# Patient Record
Sex: Female | Born: 1970 | Race: White | Hispanic: No | Marital: Married | Smoking: Never smoker
Health system: Southern US, Community
[De-identification: ages and names within clinical notes are randomized; demographics above are authoritative.]

---

## 2004-07-14 ENCOUNTER — Ambulatory Visit: Payer: Self-pay | Admitting: Obstetrics and Gynecology

## 2006-06-07 ENCOUNTER — Ambulatory Visit: Payer: Self-pay | Admitting: Surgery

## 2006-06-13 ENCOUNTER — Ambulatory Visit: Payer: Self-pay | Admitting: Internal Medicine

## 2006-06-17 ENCOUNTER — Ambulatory Visit: Payer: Self-pay | Admitting: Internal Medicine

## 2006-06-25 ENCOUNTER — Ambulatory Visit: Payer: Self-pay | Admitting: Surgery

## 2006-07-14 ENCOUNTER — Ambulatory Visit: Payer: Self-pay | Admitting: Internal Medicine

## 2006-08-13 ENCOUNTER — Ambulatory Visit: Payer: Self-pay | Admitting: Internal Medicine

## 2006-09-13 ENCOUNTER — Ambulatory Visit: Payer: Self-pay | Admitting: Internal Medicine

## 2006-09-19 ENCOUNTER — Ambulatory Visit: Payer: Self-pay | Admitting: Surgery

## 2006-09-19 ENCOUNTER — Other Ambulatory Visit: Payer: Self-pay

## 2006-09-24 ENCOUNTER — Inpatient Hospital Stay: Payer: Self-pay | Admitting: Surgery

## 2006-10-14 ENCOUNTER — Ambulatory Visit: Payer: Self-pay | Admitting: Internal Medicine

## 2006-11-13 ENCOUNTER — Encounter: Payer: Self-pay | Admitting: Internal Medicine

## 2006-11-13 ENCOUNTER — Ambulatory Visit: Payer: Self-pay | Admitting: Internal Medicine

## 2006-12-14 ENCOUNTER — Ambulatory Visit: Payer: Self-pay | Admitting: Internal Medicine

## 2007-01-13 ENCOUNTER — Ambulatory Visit: Payer: Self-pay | Admitting: Internal Medicine

## 2007-02-13 ENCOUNTER — Ambulatory Visit: Payer: Self-pay | Admitting: Internal Medicine

## 2007-03-16 ENCOUNTER — Ambulatory Visit: Payer: Self-pay | Admitting: Internal Medicine

## 2007-04-13 ENCOUNTER — Ambulatory Visit: Payer: Self-pay | Admitting: Internal Medicine

## 2007-05-14 ENCOUNTER — Ambulatory Visit: Payer: Self-pay | Admitting: Internal Medicine

## 2007-06-13 ENCOUNTER — Ambulatory Visit: Payer: Self-pay | Admitting: Internal Medicine

## 2007-06-26 ENCOUNTER — Other Ambulatory Visit: Payer: Self-pay

## 2007-06-26 ENCOUNTER — Ambulatory Visit: Payer: Self-pay | Admitting: Obstetrics and Gynecology

## 2007-07-04 ENCOUNTER — Ambulatory Visit: Payer: Self-pay | Admitting: Obstetrics and Gynecology

## 2007-07-14 ENCOUNTER — Ambulatory Visit: Payer: Self-pay | Admitting: Internal Medicine

## 2007-08-13 ENCOUNTER — Ambulatory Visit: Payer: Self-pay | Admitting: Internal Medicine

## 2007-09-13 ENCOUNTER — Ambulatory Visit: Payer: Self-pay | Admitting: Internal Medicine

## 2007-10-14 ENCOUNTER — Ambulatory Visit: Payer: Self-pay | Admitting: Internal Medicine

## 2007-11-13 ENCOUNTER — Ambulatory Visit: Payer: Self-pay | Admitting: Internal Medicine

## 2007-12-14 ENCOUNTER — Ambulatory Visit: Payer: Self-pay | Admitting: Internal Medicine

## 2008-01-13 ENCOUNTER — Ambulatory Visit: Payer: Self-pay | Admitting: Internal Medicine

## 2008-01-19 ENCOUNTER — Ambulatory Visit: Payer: Self-pay | Admitting: Internal Medicine

## 2008-02-13 ENCOUNTER — Ambulatory Visit: Payer: Self-pay | Admitting: Internal Medicine

## 2008-03-01 ENCOUNTER — Ambulatory Visit: Payer: Self-pay | Admitting: Internal Medicine

## 2008-03-15 ENCOUNTER — Ambulatory Visit: Payer: Self-pay | Admitting: Internal Medicine

## 2008-05-13 ENCOUNTER — Ambulatory Visit: Payer: Self-pay | Admitting: Internal Medicine

## 2008-05-24 ENCOUNTER — Ambulatory Visit: Payer: Self-pay | Admitting: Internal Medicine

## 2008-06-12 ENCOUNTER — Ambulatory Visit: Payer: Self-pay | Admitting: Internal Medicine

## 2008-08-12 ENCOUNTER — Ambulatory Visit: Payer: Self-pay | Admitting: Internal Medicine

## 2008-08-30 ENCOUNTER — Ambulatory Visit: Payer: Self-pay | Admitting: Internal Medicine

## 2008-09-12 ENCOUNTER — Ambulatory Visit: Payer: Self-pay | Admitting: Internal Medicine

## 2008-10-01 ENCOUNTER — Ambulatory Visit: Payer: Self-pay | Admitting: Surgery

## 2009-02-12 ENCOUNTER — Ambulatory Visit: Payer: Self-pay | Admitting: Internal Medicine

## 2009-02-28 ENCOUNTER — Ambulatory Visit: Payer: Self-pay | Admitting: Internal Medicine

## 2009-03-15 ENCOUNTER — Ambulatory Visit: Payer: Self-pay | Admitting: Internal Medicine

## 2009-08-12 ENCOUNTER — Ambulatory Visit: Payer: Self-pay | Admitting: Internal Medicine

## 2009-08-29 ENCOUNTER — Ambulatory Visit: Payer: Self-pay | Admitting: Internal Medicine

## 2009-08-30 LAB — CANCER ANTIGEN 27.29: CA 27.29: 22 U/mL (ref 0.0–38.6)

## 2009-09-12 ENCOUNTER — Ambulatory Visit: Payer: Self-pay | Admitting: Internal Medicine

## 2009-10-13 ENCOUNTER — Ambulatory Visit: Payer: Self-pay | Admitting: Internal Medicine

## 2010-02-27 ENCOUNTER — Ambulatory Visit: Payer: Self-pay | Admitting: Internal Medicine

## 2010-02-28 LAB — CANCER ANTIGEN 27.29: CA 27.29: 22.8 U/mL (ref 0.0–38.6)

## 2010-03-15 ENCOUNTER — Ambulatory Visit: Payer: Self-pay | Admitting: Internal Medicine

## 2010-04-13 ENCOUNTER — Ambulatory Visit: Payer: Self-pay | Admitting: Internal Medicine

## 2010-09-11 ENCOUNTER — Ambulatory Visit: Payer: Self-pay | Admitting: Internal Medicine

## 2010-09-13 ENCOUNTER — Ambulatory Visit: Payer: Self-pay | Admitting: Internal Medicine

## 2010-10-14 ENCOUNTER — Ambulatory Visit: Payer: Self-pay | Admitting: Internal Medicine

## 2011-07-06 ENCOUNTER — Ambulatory Visit: Payer: Self-pay | Admitting: Oncology

## 2011-09-12 ENCOUNTER — Ambulatory Visit: Payer: Self-pay | Admitting: Oncology

## 2011-09-12 LAB — COMPREHENSIVE METABOLIC PANEL
Albumin: 4.1 g/dL (ref 3.4–5.0)
Alkaline Phosphatase: 115 U/L (ref 50–136)
Anion Gap: 8 (ref 7–16)
BUN: 15 mg/dL (ref 7–18)
Calcium, Total: 9.5 mg/dL (ref 8.5–10.1)
Creatinine: 1.11 mg/dL (ref 0.60–1.30)
EGFR (African American): >60
Glucose: 129 mg/dL — ABNORMAL HIGH (ref 65–99)
Osmolality: 278 (ref 275–301)
Potassium: 3.9 mmol/L (ref 3.5–5.1)
Sodium: 138 mmol/L (ref 136–145)
Total Protein: 7.6 g/dL (ref 6.4–8.2)

## 2011-09-12 LAB — CBC CANCER CENTER
Basophil #: 0 x10 3/mm (ref 0.0–0.1)
Basophil %: 1.1 %
Eosinophil #: 0.2 x10 3/mm (ref 0.0–0.7)
HCT: 36.8 % (ref 35.0–47.0)
HGB: 12.5 g/dL (ref 12.0–16.0)
Lymphocyte #: 1.6 x10 3/mm (ref 1.0–3.6)
MCH: 31.3 pg (ref 26.0–34.0)
MCHC: 34.1 g/dL (ref 32.0–36.0)
Monocyte #: 0.2 x10 3/mm (ref 0.2–0.9)
Neutrophil %: 51 %
Platelet: 250 x10 3/mm (ref 150–440)

## 2011-09-13 ENCOUNTER — Ambulatory Visit: Payer: Self-pay | Admitting: Oncology

## 2011-09-13 LAB — CANCER ANTIGEN 27.29: CA 27.29: 13.8 U/mL (ref 0.0–38.6)

## 2012-09-10 ENCOUNTER — Ambulatory Visit: Payer: Self-pay | Admitting: Oncology

## 2012-09-11 LAB — CANCER ANTIGEN 27.29: CA 27.29: 19.7 U/mL (ref 0.0–38.6)

## 2012-09-12 ENCOUNTER — Ambulatory Visit: Payer: Self-pay | Admitting: Oncology

## 2012-10-13 ENCOUNTER — Ambulatory Visit: Payer: Self-pay | Admitting: Oncology

## 2013-09-09 ENCOUNTER — Ambulatory Visit: Payer: Self-pay | Admitting: Oncology

## 2013-09-10 LAB — CANCER ANTIGEN 27.29: CA 27.29: 19.7 U/mL (ref 0.0–38.6)

## 2013-09-12 ENCOUNTER — Ambulatory Visit: Payer: Self-pay | Admitting: Oncology

## 2015-06-30 ENCOUNTER — Emergency Department
Admission: EM | Admit: 2015-06-30 | Discharge: 2015-06-30 | Disposition: A | Discharge status: Home / Self Care | Payer: Self-pay | Attending: Emergency Medicine | Admitting: Emergency Medicine

## 2015-06-30 ENCOUNTER — Emergency Department: Payer: Self-pay

## 2015-06-30 ENCOUNTER — Encounter: Payer: Self-pay | Admitting: *Deleted

## 2015-06-30 DIAGNOSIS — Y999 Unspecified external cause status: Secondary | ICD-10-CM | POA: Insufficient documentation

## 2015-06-30 DIAGNOSIS — W010XXA Fall on same level from slipping, tripping and stumbling without subsequent striking against object, initial encounter: Secondary | ICD-10-CM | POA: Insufficient documentation

## 2015-06-30 DIAGNOSIS — S6991XA Unspecified injury of right wrist, hand and finger(s), initial encounter: Secondary | ICD-10-CM | POA: Insufficient documentation

## 2015-06-30 DIAGNOSIS — Y939 Activity, unspecified: Secondary | ICD-10-CM | POA: Insufficient documentation

## 2015-06-30 DIAGNOSIS — Y929 Unspecified place or not applicable: Secondary | ICD-10-CM | POA: Insufficient documentation

## 2015-06-30 DIAGNOSIS — M25531 Pain in right wrist: Secondary | ICD-10-CM

## 2015-06-30 NOTE — ED Notes (Signed)

## 2015-06-30 NOTE — Discharge Instructions (Signed)
Joint Pain  Joint pain can be caused by many things. The joint can be bruised, infected, weak from aging, or sore from exercise. The pain will probably go away if you follow your doctor's instructions for home care. If your joint pain continues, more tests may be needed to help find the cause of your condition.  HOME CARE  Watch your condition for any changes. Follow these instructions as told to lessen the pain that you are feeling:  · Take medicines only as told by your doctor.  · Rest the sore joint for as long as told by your doctor. If your doctor tells you to, raise (elevate) the painful joint above the level of your heart while you are sitting or lying down.  · Do not do things that cause pain or make the pain worse.  · If told, put ice on the painful area:    Put ice in a plastic bag.    Place a towel between your skin and the bag.    Leave the ice on for 20 minutes, 2-3 times per day.  · Wear an elastic bandage, splint, or sling as told by your doctor. Loosen the bandage or splint if your fingers or toes lose feeling (become numb) and tingle, or if they turn cold and blue.  · Begin exercising or stretching the joint as told by your doctor. Ask your doctor what types of exercise are safe for you.  · Keep all follow-up visits as told by your doctor. This is important.  GET HELP IF:  · Your pain gets worse and medicine does not help it.  · Your joint pain does not get better in 3 days.  · You have more bruising or swelling.  · You have a fever.  · You lose 10 pounds (4.5 kg) or more without trying.  GET HELP RIGHT AWAY IF:  · You are not able to move the joint.  · Your fingers or toes become numb or they turn cold and blue.     This information is not intended to replace advice given to you by your health care provider. Make sure you discuss any questions you have with your health care provider.     Document Released: 01/17/2009 Document Revised: 02/19/2014 Document Reviewed: 11/10/2013  Elsevier Interactive  Patient Education ©2016 [employer]

## 2015-06-30 NOTE — ED Notes (Signed)
States she fell last night at visitor center while visiting a friend, right wrist pain, radial pulse 2+, warm and dry

## 2015-06-30 NOTE — ED Provider Notes (Signed)
[facility] Emergency Department Provider Note   ____________________________________________  Time seen: Approximately 840 PM  I have reviewed the triage vital signs and the nursing notes.   HISTORY  Chief Complaint Wrist Pain   HPI Annette SHORKEY is a 45 y.o. female without any chronic medical issues was presenting to the emergency department forright posterior wrist pain after fall last night. The patient said that she did trip and fall of visiting a friend. Said that she tried to stop herself with an outstretched right hand and hyper extended the wrist. He said that at first there was not much pain but today there is been increased swelling to the dorsum along the distal radius with increased pain with extension. The patient tried Aleve this afternoon with minimal relief. Patient denies hitting her head or any loss of consciousness.    History reviewed. No pertinent past medical history.  There are no active problems to display for this patient.   No past surgical history on file.  No current outpatient prescriptions on file.  Allergies Codeine  History reviewed. No pertinent family history.  Social History Social History  Substance Use Topics  . Smoking status: None  . Smokeless tobacco: None  . Alcohol Use: None    Review of Systems Constitutional: No fever/chills Eyes: No visual changes. ENT: No sore throat. Cardiovascular: Denies chest pain. Respiratory: Denies shortness of breath. Gastrointestinal: No abdominal pain.  No nausea, no vomiting.  No diarrhea.  No constipation. Genitourinary: Negative for dysuria. Musculoskeletal: Negative for back pain. Skin: Negative for rash. Neurological: Negative for headaches, focal weakness or numbness.  10-point ROS otherwise negative.  ____________________________________________   PHYSICAL EXAM:  VITAL SIGNS: ED Triage Vitals  Enc Vitals Group     BP 06/30/15 1832 142/63 mmHg     Pulse Rate 06/30/15 1832 79     Resp 06/30/15 1832 18     Temp 06/30/15 1832 98.7 F (37.1 C)     Temp Source 06/30/15 1832 Oral     SpO2 06/30/15 1832 100 %     Weight 06/30/15 1832 213 lb (96.616 kg)     Height 06/30/15 1832  (1.753 m)     Head Cir --      Peak Flow --      Pain Score 06/30/15 1833 6     Pain Loc --      Pain Edu? --      Excl. in GC? --     Constitutional: Alert and oriented. Well appearing and in no acute distress. Eyes: Conjunctivae are normal. PERRL. EOMI. Head: Atraumatic. Nose: No congestion/rhinnorhea. Mouth/Throat: Mucous membranes are moist.   Neck: No stridor.   Cardiovascular: Normal rate, regular rhythm. Grossly normal heart sounds.   Respiratory: Normal respiratory effort.  No retractions. Lungs CTAB. Gastrointestinal:  No distention.  Musculoskeletal: Right dorsal distal radius with swelling and tenderness over an area of about 2 cm in circumference. There is no tenderness to the anatomical snuffbox. There is no pain with axial load of the thumb. The patient has full range of motion at the right wrist but with pain upon extension. There is an intact radial pulse with brisk capillary refill as well as 5 out of 5 strength distal to the injury. No tenderness over the thenar eminence. Full range of motion of the fingers. Neurologic:  Normal speech and language. No gross focal neurologic deficits are appreciated.  Skin:  Skin is warm, dry and intact. No rash noted.  Psychiatric: Mood and affect are normal. Speech and behavior are normal.  ____________________________________________   LABS (all labs ordered are listed, but only abnormal results are displayed)  Labs Reviewed - No data to display ____________________________________________  EKG   ____________________________________________  RADIOLOGY  DG Wrist Complete Right (Final result) Result time: 06/30/15 18:52:21   Final result by Rad Results In Interface (06/30/15 18:52:21)    Narrative:   CLINICAL DATA: Fall on outstretched right hand, pain posterior lateral right wrist.  EXAM: RIGHT WRIST - COMPLETE 3+ VIEW  COMPARISON: None.  FINDINGS: Osseous alignment is normal. Bone mineralization is normal. No fracture line or displaced fracture fragment seen. Adjacent soft tissues are unremarkable.  IMPRESSION: Negative.   Electronically Signed By: Bary RichardStan Maynard M.D. On: 06/30/2015 18:52    ____________________________________________   PROCEDURES    ____________________________________________   INITIAL IMPRESSION / ASSESSMENT AND PLAN / ED COURSE  Pertinent labs & imaging results that were available during my care of the patient were reviewed by me and considered in my medical decision making (see chart for details).  Patient likely with hyperextension injury resulting in swelling and pain. However no evidence of fracture. I recommended that the patient continue use her muscle cream "Tiger balm." I recommended that she also use ice as well as continue with Aleve or ibuprofen. She understands the plan and is willing to comply. Not suspecting scaphoid fracture secondary to exam above. ____________________________________________   FINAL CLINICAL IMPRESSION(S) / ED DIAGNOSES   Hyperextension injury of the wrist.   NEW MEDICATIONS STARTED DURING THIS VISIT:  New Prescriptions   No medications on file     Note:  This document was prepared using Dragon voice recognition software and may include unintentional dictation errors.    Myrna Blazeravid Matthew Zhuri Krass, MD 06/30/15 [phone]

## 2018-04-16 IMAGING — CR DG WRIST COMPLETE 3+V*R*
4 series · 4 of 4 positions shown · non-contrast
Comparison: None.

CLINICAL DATA: Fall on outstretched right hand, pain posterior
lateral right wrist.

EXAM:
RIGHT WRIST - COMPLETE 3+ VIEW

[wrist pa]
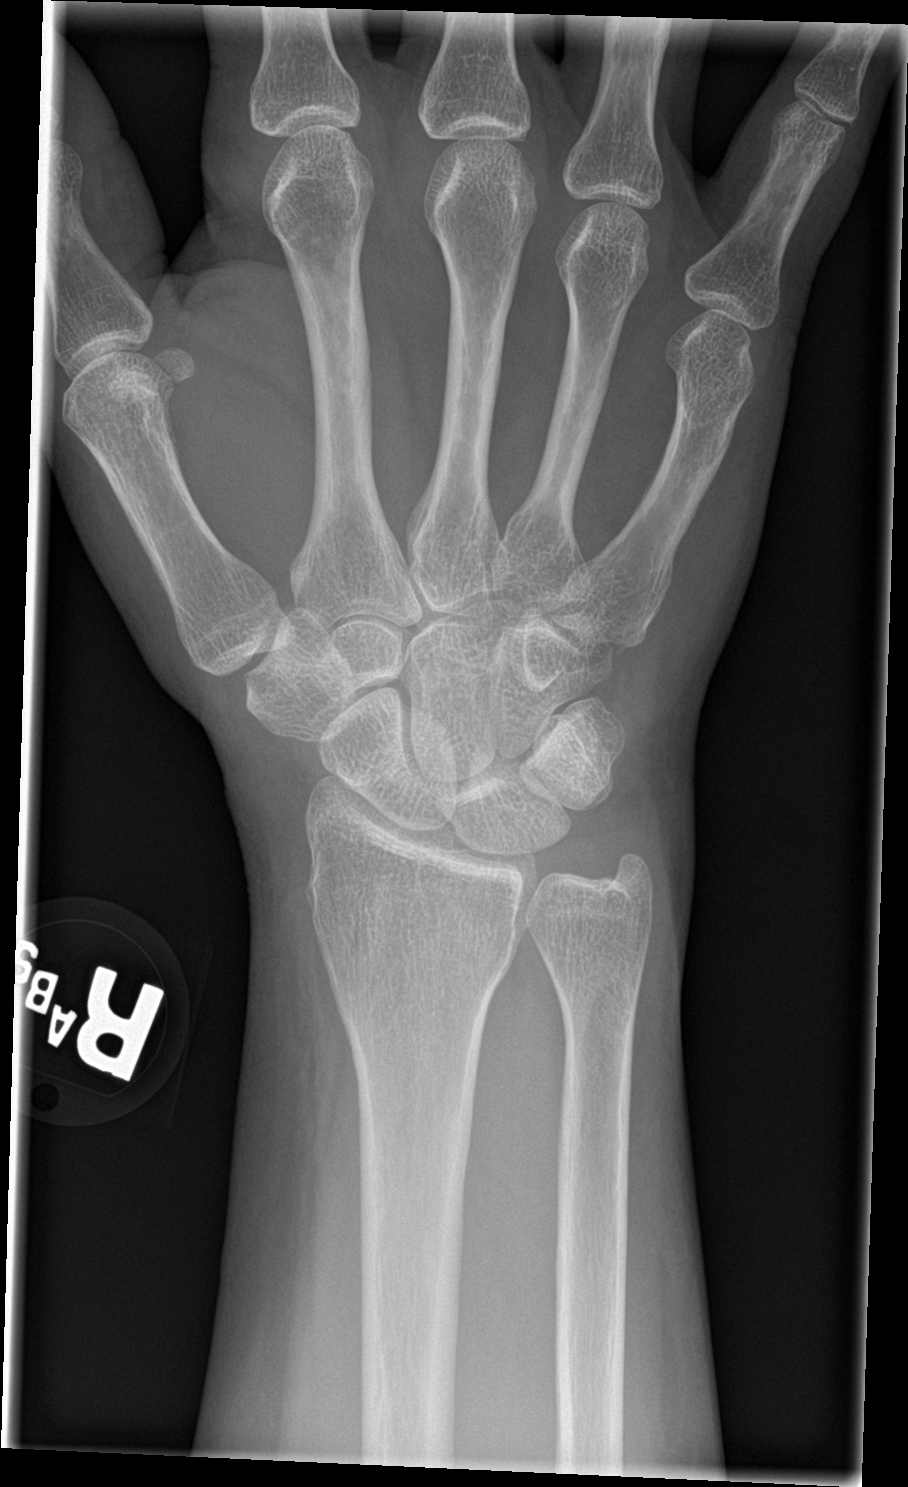

[wrist obl]
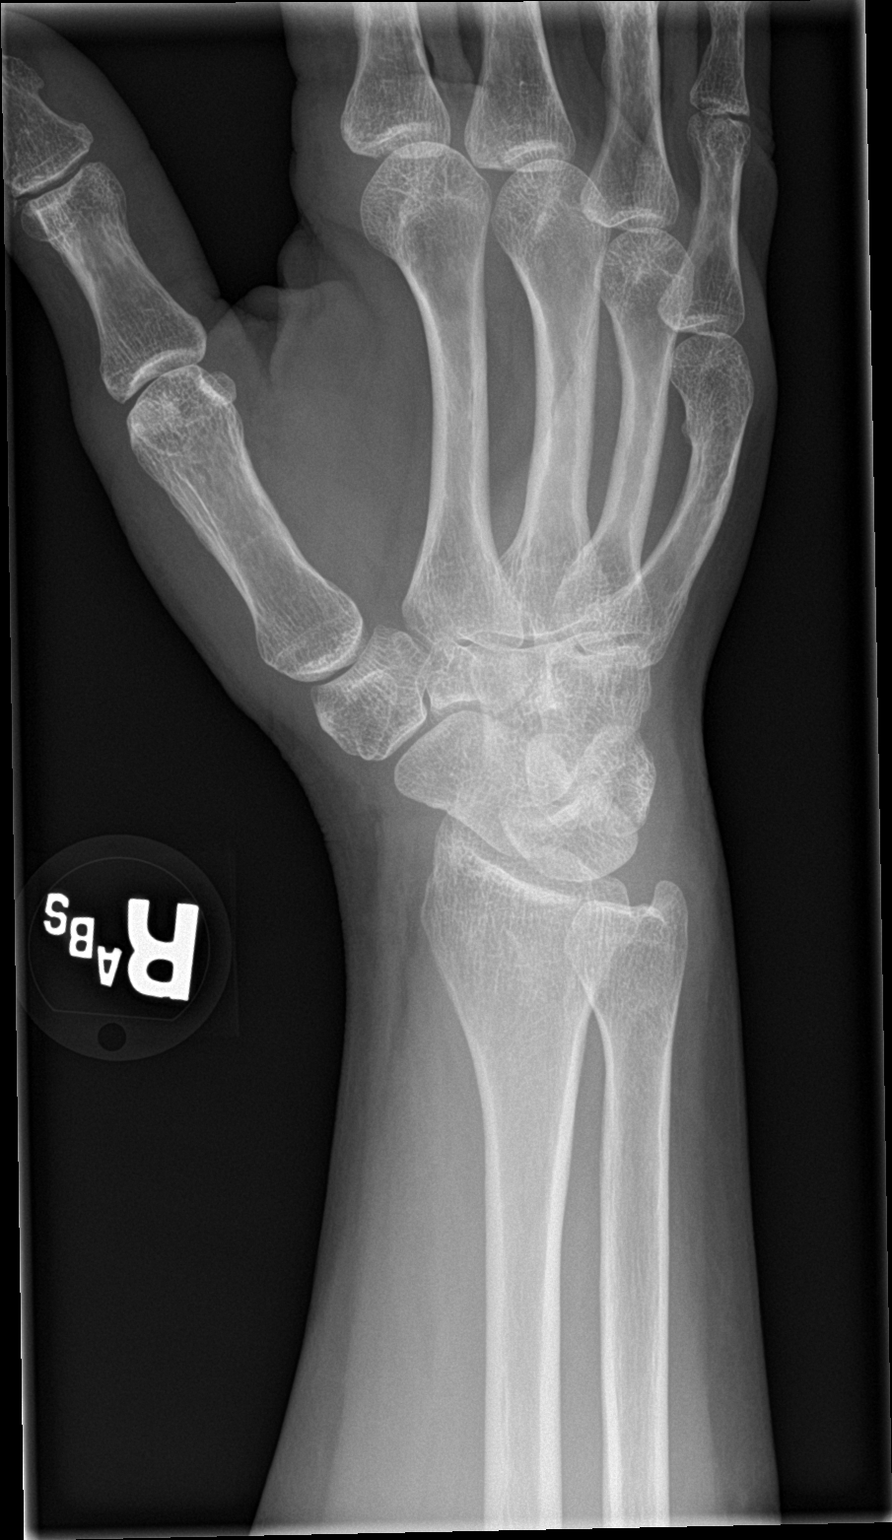

[wrist lat]
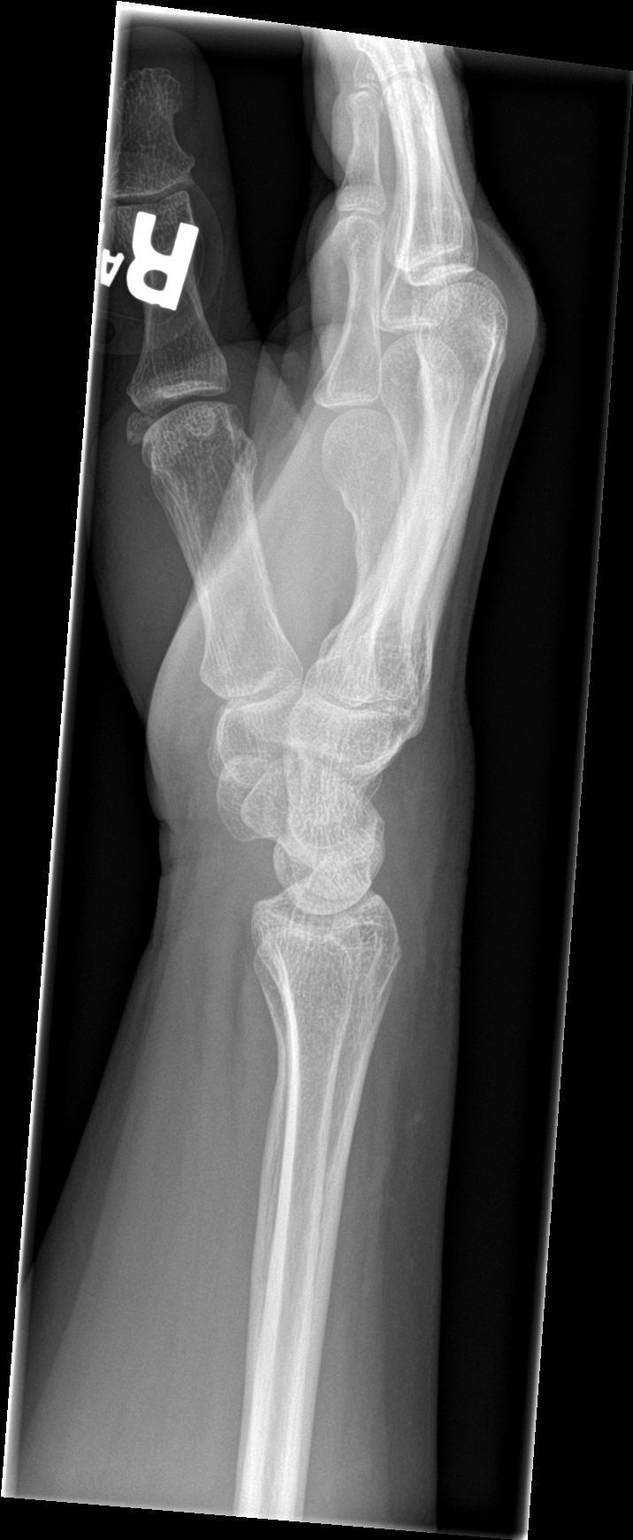

[navicular]
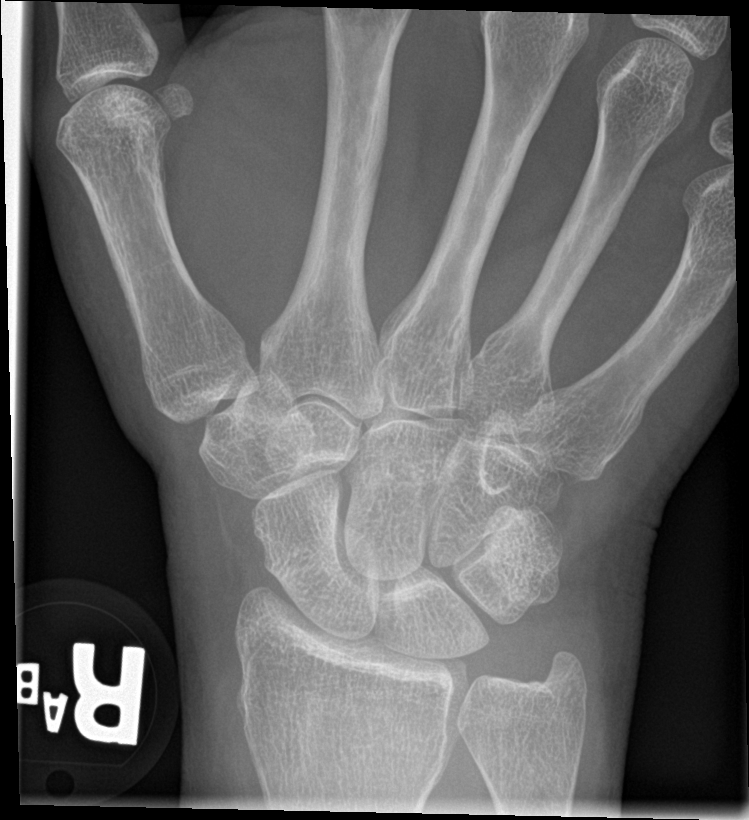

[4 of 4 positions shown; findings below may reference images not displayed]

FINDINGS: Osseous alignment is normal. Bone mineralization is normal. No
fracture line or displaced fracture fragment seen. Adjacent soft
tissues are unremarkable.
IMPRESSION: Negative.

## 2020-11-07 ENCOUNTER — Encounter
Admission: EM | Disposition: A | Discharge status: Home / Self Care | Payer: Self-pay | Source: Home / Self Care | Attending: Emergency Medicine

## 2020-11-07 ENCOUNTER — Emergency Department: Payer: Self-pay | Admitting: Anesthesiology

## 2020-11-07 ENCOUNTER — Observation Stay
Admission: EM | Admit: 2020-11-07 | Discharge: 2020-11-08 | Disposition: A | Discharge status: Home / Self Care | Payer: Self-pay | Attending: Surgery | Admitting: Surgery

## 2020-11-07 ENCOUNTER — Encounter: Payer: Self-pay | Admitting: Emergency Medicine

## 2020-11-07 ENCOUNTER — Observation Stay: Payer: Self-pay

## 2020-11-07 ENCOUNTER — Other Ambulatory Visit: Payer: Self-pay

## 2020-11-07 ENCOUNTER — Emergency Department: Payer: Self-pay

## 2020-11-07 DIAGNOSIS — Z91040 Latex allergy status: Secondary | ICD-10-CM | POA: Insufficient documentation

## 2020-11-07 DIAGNOSIS — X501XXA Overexertion from prolonged static or awkward postures, initial encounter: Secondary | ICD-10-CM | POA: Insufficient documentation

## 2020-11-07 DIAGNOSIS — S82891A Other fracture of right lower leg, initial encounter for closed fracture: Secondary | ICD-10-CM | POA: Diagnosis present

## 2020-11-07 DIAGNOSIS — S82851A Displaced trimalleolar fracture of right lower leg, initial encounter for closed fracture: Principal | ICD-10-CM | POA: Insufficient documentation

## 2020-11-07 DIAGNOSIS — Z20822 Contact with and (suspected) exposure to covid-19: Secondary | ICD-10-CM | POA: Insufficient documentation

## 2020-11-07 DIAGNOSIS — Z419 Encounter for procedure for purposes other than remedying health state, unspecified: Secondary | ICD-10-CM

## 2020-11-07 HISTORY — PX: ORIF ANKLE FRACTURE: SHX5408

## 2020-11-07 LAB — COMPREHENSIVE METABOLIC PANEL
ALT: 19 U/L (ref 0–44)
AST: 22 U/L (ref 15–41)
Albumin: 4.5 g/dL (ref 3.5–5.0)
Alkaline Phosphatase: 77 U/L (ref 38–126)
Anion gap: 8 (ref 5–15)
BUN: 7 mg/dL (ref 6–20)
CO2: 28 mmol/L (ref 22–32)
Calcium: 9.3 mg/dL (ref 8.9–10.3)
Chloride: 99 mmol/L (ref 98–111)
Creatinine, Ser: 0.7 mg/dL (ref 0.44–1.00)
GFR, Estimated: >60 mL/min (ref >60)
Glucose, Bld: 109 mg/dL — ABNORMAL HIGH (ref 70–99)
Potassium: 3.8 mmol/L (ref 3.5–5.1)
Sodium: 135 mmol/L (ref 135–145)
Total Bilirubin: 0.8 mg/dL (ref 0.3–1.2)
Total Protein: 7.7 g/dL (ref 6.5–8.1)

## 2020-11-07 LAB — CBC WITH DIFFERENTIAL/PLATELET
Abs Immature Granulocytes: 0.01 10*3/uL (ref 0.00–0.07)
Basophils Absolute: 0 10*3/uL (ref 0.0–0.1)
Basophils Relative: 0 %
Eosinophils Absolute: 0 10*3/uL (ref 0.0–0.5)
Eosinophils Relative: 0 %
HCT: 34.1 % — ABNORMAL LOW (ref 36.0–46.0)
Hemoglobin: 12.1 g/dL (ref 12.0–15.0)
Immature Granulocytes: 0 %
Lymphocytes Relative: 14 %
Lymphs Abs: 1 10*3/uL (ref 0.7–4.0)
MCH: 33.6 pg (ref 26.0–34.0)
MCHC: 35.5 g/dL (ref 30.0–36.0)
MCV: 94.7 fL (ref 80.0–100.0)
Monocytes Absolute: 0.6 10*3/uL (ref 0.1–1.0)
Monocytes Relative: 9 %
Neutro Abs: 5.1 10*3/uL (ref 1.7–7.7)
Neutrophils Relative %: 77 %
Platelets: 349 10*3/uL (ref 150–400)
RBC: 3.6 MIL/uL — ABNORMAL LOW (ref 3.87–5.11)
RDW: 12.8 % (ref 11.5–15.5)
WBC: 6.7 10*3/uL (ref 4.0–10.5)
nRBC: 0 % (ref 0.0–0.2)

## 2020-11-07 LAB — RESP PANEL BY RT-PCR (FLU A&B, COVID) ARPGX2
Influenza A by PCR: NEGATIVE
Influenza B by PCR: NEGATIVE
SARS Coronavirus 2 by RT PCR: NEGATIVE

## 2020-11-07 SURGERY — OPEN REDUCTION INTERNAL FIXATION (ORIF) ANKLE FRACTURE
Anesthesia: General | Site: Ankle | Laterality: Right

## 2020-11-07 MED ORDER — MIDAZOLAM HCL 2 MG/2ML IJ SOLN
INTRAMUSCULAR | Status: AC
  Filled 2020-11-07: qty 2

## 2020-11-07 MED ORDER — HYDROCODONE-ACETAMINOPHEN 5-325 MG PO TABS: 1.0000 | ORAL_TABLET | ORAL | Status: DC | PRN

## 2020-11-07 MED ORDER — OXYCODONE-ACETAMINOPHEN 5-325 MG PO TABS: 1.0000 | ORAL_TABLET | Freq: Once | ORAL | Status: DC

## 2020-11-07 MED ORDER — ONDANSETRON HCL 4 MG/2ML IJ SOLN: 4.0000 mg | Freq: Four times a day (QID) | INTRAMUSCULAR | Status: DC | PRN

## 2020-11-07 MED ORDER — OXYCODONE HCL 5 MG PO TABS: 5.0000 mg | ORAL_TABLET | Freq: Once | ORAL | Status: DC | PRN

## 2020-11-07 MED ORDER — DOCUSATE SODIUM 100 MG PO CAPS
100.0000 mg | ORAL_CAPSULE | Freq: Two times a day (BID) | ORAL | Status: DC
  Administered 2020-11-07: 100 mg via ORAL
  Filled 2020-11-07 (×3): qty 1

## 2020-11-07 MED ORDER — KETOROLAC TROMETHAMINE 30 MG/ML IJ SOLN
INTRAMUSCULAR | Status: AC
  Filled 2020-11-07: qty 1

## 2020-11-07 MED ORDER — FENTANYL CITRATE (PF) 100 MCG/2ML IJ SOLN
25.0000 ug | INTRAMUSCULAR | Status: DC | PRN
  Administered 2020-11-07: 25 ug via INTRAVENOUS

## 2020-11-07 MED ORDER — FENTANYL CITRATE (PF) 100 MCG/2ML IJ SOLN
INTRAMUSCULAR | Status: DC | PRN
  Administered 2020-11-07 (×3): 50 ug via INTRAVENOUS

## 2020-11-07 MED ORDER — MORPHINE SULFATE (PF) 2 MG/ML IV SOLN: 0.5000 mg | INTRAVENOUS | Status: DC | PRN

## 2020-11-07 MED ORDER — METOCLOPRAMIDE HCL 5 MG/ML IJ SOLN: 5.0000 mg | Freq: Three times a day (TID) | INTRAMUSCULAR | Status: DC | PRN

## 2020-11-07 MED ORDER — ACETAMINOPHEN 10 MG/ML IV SOLN
INTRAVENOUS | Status: DC | PRN
  Administered 2020-11-07: 1000 mg via INTRAVENOUS

## 2020-11-07 MED ORDER — ACETAMINOPHEN 325 MG PO TABS: 325.0000 mg | ORAL_TABLET | Freq: Four times a day (QID) | ORAL | Status: DC | PRN

## 2020-11-07 MED ORDER — LIDOCAINE HCL (CARDIAC) PF 100 MG/5ML IV SOSY
PREFILLED_SYRINGE | INTRAVENOUS | Status: DC | PRN
  Administered 2020-11-07: 100 mg via INTRAVENOUS

## 2020-11-07 MED ORDER — BUPIVACAINE HCL (PF) 0.5 % IJ SOLN
INTRAMUSCULAR | Status: AC
  Filled 2020-11-07: qty 30

## 2020-11-07 MED ORDER — MEPERIDINE HCL 25 MG/ML IJ SOLN: 6.2500 mg | INTRAMUSCULAR | Status: DC | PRN

## 2020-11-07 MED ORDER — CEFAZOLIN SODIUM-DEXTROSE 2-4 GM/100ML-% IV SOLN
INTRAVENOUS | Status: AC
  Filled 2020-11-07: qty 100

## 2020-11-07 MED ORDER — CEFAZOLIN SODIUM-DEXTROSE 2-4 GM/100ML-% IV SOLN
2.0000 g | INTRAVENOUS | Status: AC
  Administered 2020-11-07: 2 g via INTRAVENOUS
  Filled 2020-11-07: qty 100

## 2020-11-07 MED ORDER — ACETAMINOPHEN 500 MG PO TABS
500.0000 mg | ORAL_TABLET | Freq: Four times a day (QID) | ORAL | Status: DC
  Administered 2020-11-08 (×2): 500 mg via ORAL
  Filled 2020-11-07 (×3): qty 1

## 2020-11-07 MED ORDER — FENTANYL CITRATE (PF) 100 MCG/2ML IJ SOLN
INTRAMUSCULAR | Status: AC
  Filled 2020-11-07: qty 2

## 2020-11-07 MED ORDER — ACETAMINOPHEN 10 MG/ML IV SOLN
INTRAVENOUS | Status: AC
  Filled 2020-11-07: qty 100

## 2020-11-07 MED ORDER — LORATADINE 10 MG PO TABS
10.0000 mg | ORAL_TABLET | Freq: Every day | ORAL | Status: DC
  Administered 2020-11-08: 10 mg via ORAL
  Filled 2020-11-07: qty 1

## 2020-11-07 MED ORDER — SODIUM CHLORIDE 0.9 % IV SOLN: INTRAVENOUS | Status: DC

## 2020-11-07 MED ORDER — DIPHENHYDRAMINE HCL 12.5 MG/5ML PO ELIX
12.5000 mg | ORAL_SOLUTION | ORAL | Status: DC | PRN
  Filled 2020-11-07: qty 10

## 2020-11-07 MED ORDER — MAGNESIUM HYDROXIDE 400 MG/5ML PO SUSP: 30.0000 mL | Freq: Every day | ORAL | Status: DC | PRN

## 2020-11-07 MED ORDER — FLEET ENEMA 7-19 GM/118ML RE ENEM: 1.0000 | ENEMA | Freq: Once | RECTAL | Status: DC | PRN

## 2020-11-07 MED ORDER — TRAMADOL HCL 50 MG PO TABS: 50.0000 mg | ORAL_TABLET | Freq: Four times a day (QID) | ORAL | Status: DC | PRN

## 2020-11-07 MED ORDER — OXYCODONE HCL 5 MG/5ML PO SOLN: 5.0000 mg | Freq: Once | ORAL | Status: DC | PRN

## 2020-11-07 MED ORDER — KETOROLAC TROMETHAMINE 30 MG/ML IJ SOLN
30.0000 mg | Freq: Once | INTRAMUSCULAR | Status: AC
  Administered 2020-11-07: 30 mg via INTRAVENOUS

## 2020-11-07 MED ORDER — PROPOFOL 10 MG/ML IV BOLUS
INTRAVENOUS | Status: DC | PRN
Start: 2020-11-07 — End: 2020-11-07
  Administered 2020-11-07: 150 mg via INTRAVENOUS

## 2020-11-07 MED ORDER — CEFAZOLIN SODIUM-DEXTROSE 2-4 GM/100ML-% IV SOLN
2.0000 g | Freq: Four times a day (QID) | INTRAVENOUS | Status: AC
  Administered 2020-11-07 – 2020-11-08 (×3): 2 g via INTRAVENOUS
  Filled 2020-11-07 (×3): qty 100

## 2020-11-07 MED ORDER — ONDANSETRON HCL 4 MG/2ML IJ SOLN
INTRAMUSCULAR | Status: DC | PRN
  Administered 2020-11-07: 4 mg via INTRAVENOUS

## 2020-11-07 MED ORDER — ONDANSETRON HCL 4 MG PO TABS: 4.0000 mg | ORAL_TABLET | Freq: Four times a day (QID) | ORAL | Status: DC | PRN

## 2020-11-07 MED ORDER — LACTATED RINGERS IV SOLN: INTRAVENOUS | Status: DC | PRN

## 2020-11-07 MED ORDER — BISACODYL 10 MG RE SUPP
10.0000 mg | Freq: Every day | RECTAL | Status: DC | PRN
  Filled 2020-11-07: qty 1

## 2020-11-07 MED ORDER — MORPHINE SULFATE (PF) 4 MG/ML IV SOLN
4.0000 mg | Freq: Once | INTRAVENOUS | Status: AC
  Administered 2020-11-07: 4 mg via INTRAVENOUS
  Filled 2020-11-07: qty 1

## 2020-11-07 MED ORDER — PHENYLEPHRINE HCL (PRESSORS) 10 MG/ML IV SOLN
INTRAVENOUS | Status: DC | PRN
  Administered 2020-11-07 (×3): 100 ug via INTRAVENOUS

## 2020-11-07 MED ORDER — ROCURONIUM BROMIDE 100 MG/10ML IV SOLN
INTRAVENOUS | Status: DC | PRN
  Administered 2020-11-07: 50 mg via INTRAVENOUS

## 2020-11-07 MED ORDER — DEXAMETHASONE SODIUM PHOSPHATE 10 MG/ML IJ SOLN
INTRAMUSCULAR | Status: DC | PRN
Start: 2020-11-07 — End: 2020-11-07
  Administered 2020-11-07: 10 mg via INTRAVENOUS

## 2020-11-07 MED ORDER — KETOROLAC TROMETHAMINE 15 MG/ML IJ SOLN
INTRAMUSCULAR | Status: AC
  Filled 2020-11-07: qty 1

## 2020-11-07 MED ORDER — KETOROLAC TROMETHAMINE 15 MG/ML IJ SOLN
15.0000 mg | Freq: Four times a day (QID) | INTRAMUSCULAR | Status: DC
  Administered 2020-11-07 – 2020-11-08 (×2): 15 mg via INTRAVENOUS
  Filled 2020-11-07 (×3): qty 1

## 2020-11-07 MED ORDER — MIDAZOLAM HCL 2 MG/2ML IJ SOLN
INTRAMUSCULAR | Status: DC | PRN
  Administered 2020-11-07: 2 mg via INTRAVENOUS

## 2020-11-07 MED ORDER — 0.9 % SODIUM CHLORIDE (POUR BTL) OPTIME
TOPICAL | Status: DC | PRN
  Administered 2020-11-07: 200 mL

## 2020-11-07 MED ORDER — BUPIVACAINE HCL 0.5 % IJ SOLN
INTRAMUSCULAR | Status: DC | PRN
  Administered 2020-11-07: 20 mL

## 2020-11-07 MED ORDER — ENOXAPARIN SODIUM 40 MG/0.4ML IJ SOSY
40.0000 mg | PREFILLED_SYRINGE | INTRAMUSCULAR | Status: DC
  Administered 2020-11-08: 40 mg via SUBCUTANEOUS
  Filled 2020-11-07: qty 0.4

## 2020-11-07 MED ORDER — METOCLOPRAMIDE HCL 10 MG PO TABS: 5.0000 mg | ORAL_TABLET | Freq: Three times a day (TID) | ORAL | Status: DC | PRN

## 2020-11-07 MED ORDER — PROMETHAZINE HCL 25 MG/ML IJ SOLN: 6.2500 mg | INTRAMUSCULAR | Status: DC | PRN

## 2020-11-07 MED ORDER — ONDANSETRON HCL 4 MG/2ML IJ SOLN
4.0000 mg | Freq: Once | INTRAMUSCULAR | Status: AC
  Administered 2020-11-07: 4 mg via INTRAVENOUS
  Filled 2020-11-07: qty 2

## 2020-11-07 SURGICAL SUPPLY — 65 items
BIT DRILL 2.5X2.75 QC CALB (BIT) ×2 IMPLANT
BIT DRILL 2.9 CANN QC NONSTRL (BIT) ×2 IMPLANT
BIT DRILL 3.5X5.5 QC CALB (BIT) ×2 IMPLANT
BIT DRILL CALIBRATED 2.7 (BIT) ×2 IMPLANT
BLADE SURG SZ10 CARB STEEL (BLADE) ×4 IMPLANT
BNDG COHESIVE 4X5 TAN ST LF (GAUZE/BANDAGES/DRESSINGS) ×2 IMPLANT
BNDG ELASTIC 4X5.8 VLCR STR LF (GAUZE/BANDAGES/DRESSINGS) ×4 IMPLANT
BNDG ELASTIC 6X5.8 VLCR STR LF (GAUZE/BANDAGES/DRESSINGS) IMPLANT
BNDG ESMARK 6X12 TAN STRL LF (GAUZE/BANDAGES/DRESSINGS) ×2 IMPLANT
CHLORAPREP W/TINT 26 (MISCELLANEOUS) ×2 IMPLANT
CUFF TOURN SGL QUICK 24 (TOURNIQUET CUFF)
CUFF TOURN SGL QUICK 34 (TOURNIQUET CUFF)
CUFF TRNQT CYL 24X4X16.5-23 (TOURNIQUET CUFF) IMPLANT
CUFF TRNQT CYL 34X4.125X (TOURNIQUET CUFF) IMPLANT
DRAPE C-ARM XRAY 36X54 (DRAPES) ×2 IMPLANT
DRAPE C-ARMOR (DRAPES) ×2 IMPLANT
DRAPE INCISE IOBAN 66X45 STRL (DRAPES) ×2 IMPLANT
DRAPE ORTHO SPLIT 77X108 STRL (DRAPES) ×1
DRAPE SURG ORHT 6 SPLT 77X108 (DRAPES) ×1 IMPLANT
DRAPE U-SHAPE 47X51 STRL (DRAPES) ×2 IMPLANT
ELECT CAUTERY BLADE 6.4 (BLADE) ×2 IMPLANT
ELECT REM PT RETURN 9FT ADLT (ELECTROSURGICAL) ×2
ELECTRODE REM PT RTRN 9FT ADLT (ELECTROSURGICAL) ×1 IMPLANT
GAUZE 4X4 16PLY ~~LOC~~+RFID DBL (SPONGE) ×2 IMPLANT
GAUZE SPONGE 4X4 12PLY STRL (GAUZE/BANDAGES/DRESSINGS) ×2 IMPLANT
GAUZE XEROFORM 1X8 LF (GAUZE/BANDAGES/DRESSINGS) ×2 IMPLANT
GLOVE SURG ENC MOIS LTX SZ8 (GLOVE) IMPLANT
GLOVE SURG SYN 8.0 (GLOVE) ×2 IMPLANT
GLOVE SURG UNDER LTX SZ8 (GLOVE) ×2 IMPLANT
GOWN STRL REUS W/ TWL LRG LVL3 (GOWN DISPOSABLE) ×1 IMPLANT
GOWN STRL REUS W/ TWL XL LVL3 (GOWN DISPOSABLE) ×1 IMPLANT
GOWN STRL REUS W/TWL LRG LVL3 (GOWN DISPOSABLE) ×1
GOWN STRL REUS W/TWL XL LVL3 (GOWN DISPOSABLE) ×1
K-WIRE ACE 1.6X6 (WIRE) ×2
KIT TURNOVER KIT A (KITS) ×2 IMPLANT
KWIRE ACE 1.6X6 (WIRE) ×1 IMPLANT
LABEL OR SOLS (LABEL) ×2 IMPLANT
MANIFOLD NEPTUNE II (INSTRUMENTS) ×2 IMPLANT
NS IRRIG 1000ML POUR BTL (IV SOLUTION) ×2 IMPLANT
PACK EXTREMITY ARMC (MISCELLANEOUS) ×2 IMPLANT
PAD ABD DERMACEA PRESS 5X9 (GAUZE/BANDAGES/DRESSINGS) ×4 IMPLANT
PAD CAST CTTN 4X4 STRL (SOFTGOODS) ×2 IMPLANT
PAD PREP 24X41 OB/GYN DISP (PERSONAL CARE ITEMS) ×2 IMPLANT
PADDING CAST COTTON 4X4 STRL (SOFTGOODS) ×2
PLATE COMPOSITE 7HOLE (Plate) ×2 IMPLANT
SCREW ACE CAN 4.0 40M (Screw) ×2 IMPLANT
SCREW CORTICAL 3.5MM 26MM (Screw) ×2 IMPLANT
SCREW LOCK 3.5X16 DIST TIB (Screw) ×2 IMPLANT
SCREW LOCK CORT STAR 3.5X16 (Screw) ×2 IMPLANT
SCREW LOCK CORT STAR 3.5X18 (Screw) ×2 IMPLANT
SCREW LOW PROFILE 18MMX3.5MM (Screw) ×2 IMPLANT
SCREW NON LOCKING LP 3.5 14MM (Screw) ×2 IMPLANT
SCREW NON LOCKING LP 3.5 16MM (Screw) ×4 IMPLANT
SPLINT CAST 1 STEP 4X30 (MISCELLANEOUS) ×4 IMPLANT
SPONGE T-LAP 18X18 ~~LOC~~+RFID (SPONGE) ×2 IMPLANT
STAPLER SKIN PROX 35W (STAPLE) ×2 IMPLANT
STOCKINETTE IMPERV 14X48 (MISCELLANEOUS) ×2 IMPLANT
SUT VIC AB 0 CT1 36 (SUTURE) ×2 IMPLANT
SUT VIC AB 2-0 CT1 (SUTURE) ×2 IMPLANT
SUT VIC AB 2-0 SH 27 (SUTURE)
SUT VIC AB 2-0 SH 27XBRD (SUTURE) IMPLANT
SUT VIC AB 3-0 SH 27 (SUTURE) ×1
SUT VIC AB 3-0 SH 27X BRD (SUTURE) ×1 IMPLANT
SYR 10ML LL (SYRINGE) ×2 IMPLANT
WATER STERILE IRR 500ML POUR (IV SOLUTION) ×2 IMPLANT

## 2020-11-07 NOTE — Transfer of Care (Signed)
Immediate Anesthesia Transfer of Care Note  Patient: Annette Chapman  Procedure(s) Performed: OPEN REDUCTION INTERNAL FIXATION (ORIF) ANKLE FRACTURE (Right: Ankle)  Patient Location: PACU  Anesthesia Type:General  Level of Consciousness: drowsy and patient cooperative  Airway & Oxygen Therapy: Patient Spontanous Breathing  Post-op Assessment: Report given to RN and Post -op Vital signs reviewed and stable  Post vital signs: Reviewed and stable  Last Vitals:  Vitals Value Taken Time  BP 153/85 11/07/20 1721  Temp    Pulse 85 11/07/20 1723  Resp 16 11/07/20 1723  SpO2 100 % 11/07/20 1723  Vitals shown include unvalidated device data.  Last Pain:  Vitals:   11/07/20 1459  TempSrc: Tympanic  PainSc: 0-No pain      Patients Stated Pain Goal: 0 (11/07/20 1459)  Complications: No notable events documented.

## 2020-11-07 NOTE — ED Triage Notes (Signed)
Pt reports that yesterday she stepped off the curb and landed wrong and rolled her right ankle. She tried elevating and icing it last night but woke up this am with ankle and foot swelling.

## 2020-11-07 NOTE — Plan of Care (Signed)

## 2020-11-07 NOTE — H&P (Signed)
Subjective:  Chief complaint: Right ankle pain.  The patient is a 50 y.o. female who sustained an injury to the right ankle earlier this morning when she stepped off a curb and rolled her ankle while leaving the local [employer].  She was brought to the emergency room where x-rays demonstrated an essentially nondisplaced trimalleolar fracture of her right ankle.  She was placed into a posterior splint by the ER provider.  The patient denies any associated injury.  She did not strike her head or lose consciousness.  The patient also denies any light-headedness, dizziness, chest pain, or shortness of breath which might have contributed to the injury.  There are no problems to display for this patient.  History reviewed. No pertinent past medical history.  History reviewed. No pertinent surgical history.  Medications Prior to Admission  Medication Sig Dispense Refill Last Dose   loratadine (CLARITIN) 10 MG tablet Take 10 mg by mouth daily.   11/06/2020   Allergies  Allergen Reactions   Codeine     Social History   Tobacco Use   Smoking status: Never   Smokeless tobacco: Never  Substance Use Topics   Alcohol use: Not on file    History reviewed. No pertinent family history.   Review of Systems: As noted above. The patient denies any chest pain, shortness of breath, nausea, vomiting, diarrhea, constipation, belly pain, blood in his/her stool, or burning with urination.  Objective: Temp:  [98.4 F (36.9 C)-98.8 F (37.1 C)] 98.4 F (36.9 C) (09/26 1459) Pulse Rate:  [64-82] 64 (09/26 1459) Resp:  [18] 18 (09/26 1459) BP: (102-123)/(58-71) 123/58 (09/26 1459) SpO2:  [98 %-100 %] 100 % (09/26 1459) Weight:  [73.5 kg] 73.5 kg (09/26 1459)  Physical Exam: General:  Alert, no acute distress Psychiatric:  Patient is competent for consent with normal mood and affect Cardiovascular:  RRR  Respiratory:  Clear to auscultation. No wheezing. Non-labored breathing GI:  Abdomen is soft and  non-tender Skin:  No lesions in the area of chief complaint Neurologic:  Sensation intact distally Lymphatic:  No axillary or cervical lymphadenopathy  Orthopedic Exam:  Orthopedic examination is limited to the right lower leg and foot.  The leg is in a posterior splint maintaining the ankle in near neutral dorsiflexion.  Skin inspection of the proximal and distal margins of the splint shows that the skin appears to be intact.  There is mild swelling, but no erythema, ecchymosis, abrasions, or other skin abnormalities noted.  She is able to dorsiflex and plantarflex her toes.  Sensation is intact to light touch to all digits.  She has excellent capillary refill to all digits.  Imaging Review: Recent x-rays of the right ankle are available for review and have been reviewed by myself.  These films demonstrate essentially nondisplaced fractures of the medial malleolus, lateral malleolus, and the posterior malleolus of the right ankle.  No significant degenerative changes are identified.  No lytic lesions or other acute bony abnormalities are noted.  Assessment: Essentially nondisplaced trimalleolar fracture, right ankle.  Plan: The treatment options, including both surgical and nonsurgical choices, have been discussed in detail with the patient and her family.  The patient would like to proceed with surgical intervention to include an open reduction and internal fixation of the medial and lateral malleolar fractures.  The risks (including bleeding, infection, nerve and/or blood vessel injury, persistent or recurrent pain, loosening or failure of the components, leg length inequality, dislocation, need for further surgery, blood clots, strokes, heart  attacks or arrhythmias, pneumonia, etc.) and benefits of the surgical procedure were discussed.  The patient states her understanding and agrees to proceed.  A formal written consent will be obtained by the nursing staff.

## 2020-11-07 NOTE — Anesthesia Procedure Notes (Signed)
Procedure Name: Intubation Date/Time: 11/07/2020 3:39 PM Performed by: Ginger Carne, CRNA Pre-anesthesia Checklist: Patient identified, Emergency Drugs available, Suction available, Patient being monitored and Timeout performed Patient Re-evaluated:Patient Re-evaluated prior to induction Oxygen Delivery Method: Circle system utilized Preoxygenation: Pre-oxygenation with 100% oxygen Induction Type: IV induction Ventilation: Mask ventilation without difficulty Laryngoscope Size: McGraph and 3 Grade View: Grade I Tube type: Oral Tube size: 7.0 mm Number of attempts: 1 Airway Equipment and Method: Stylet and Video-laryngoscopy Placement Confirmation: ETT inserted through vocal cords under direct vision, positive ETCO2 and breath sounds checked- equal and bilateral Secured at: 21 cm Tube secured with: Tape Dental Injury: Teeth and Oropharynx as per pre-operative assessment

## 2020-11-07 NOTE — ED Notes (Signed)
See triage note  presents with pain and swelling to right ankle  states she rolled her ankle yesterday  positive swelling  and bruising  good pulses

## 2020-11-07 NOTE — Op Note (Signed)
11/07/2020  5:29 PM  Patient:   Annette Chapman  Pre-Op Diagnosis:   Minimally displaced trimalleolar fracture, right ankle.  Post-Op Diagnosis:   Same.  Procedure:   Open reduction and internal fixation of medial and lateral malleoli, right ankle.  Surgeon:   Maryagnes Amos, MD  Assistant:   None  Anesthesia:   GET  Findings:   As above.  Complications:   None  EBL:   25 cc  Fluids:   800 cc crystalloid  UOP:   None  TT:   64 min at 250 mmHg  Drains:   None  Closure:   Staples  Implants:   Biomet ALPS 7-hole composite locking plate and screws  Brief Clinical Note:   The patient is a 50 year old female who sustained the above-noted injury yesterday morning when she rolled her ankle stepping off a curb while [place] [employer] after breakfast. She did not seek treatment immediately, but because of continued pain this morning, as well as progressive swelling and ecchymosis, she presented to the emergency room where x-rays demonstrated the above-noted injury. The patient presents at this time for definitive management of his/her injury.  Procedure:   The patient was brought into the operating room and lain in the supine position. After adequate general endotracheal intubation and anesthesia was obtained, the right foot and lower leg were prepped with ChloraPrep solution, then draped sterilely. Preoperative antibiotics were administered. A timeout was performed to verify the appropriate surgical site before the limb was exsanguinated with an Esmarch and the calf tourniquet inflated to 250 mmHg.   Laterally, an 8-10 cm incision was made over the lateral aspect of the distal fibula. The incision was carried down through the subcutaneous tissues to expose the fracture site. The fracture hematoma was debrided before the fracture was reduced and temporarily secured using a bone clamp. A lag screw was placed in an anterior to posterior direction perpendicular to the fracture. A 7-hole  Biomet composite locking plate was contoured using the appropriate plate benders before it was applied over the lateral aspect of the distal fibula. After verifying its position fluoroscopically, it was secured using a 3.5 mm nonlocking cortical screw proximal to the fracture. Again the plate's position was adjusted slightly based on AP and lateral projections before it was secured using additional bicortical screws proximally and multiple locking screws distally. The adequacy of fracture reduction and hardware position was verified fluoroscopically in AP and lateral projections and found to be excellent. One nonlocking screw proximally was were removed and replaced with shorter screws as it appeared to be too long.  Attention was directed to the medial side. An approximately 4 cm longitudinal incision was made over the anterior and distal portions of the medial malleolus. This incision also was carried down through the subcutaneous tissues to expose the fracture site. Care was taken to identify and protect the saphenous nerve and vein. The fracture hematoma again was removed before the fracture was reduced. A single guidewires were placed obliquely across the fracture from distal to proximal into the distal tibial metaphysis. After verifying its position fluoroscopically, the guidewire was over-reamed and replaced with a 40 mm partially threaded 4.0 cancellous screw in lag fashion. Again the adequacy of fracture reduction, hardware position, and mortise restoration was verified in AP, lateral, and oblique projections and found to be excellent.  Each wound was copiously irrigated with sterile saline solution. Laterally, the subcutaneous tissues were closed in two layers using 2-0 Vicryl and 3-0 Vicryl interrupted  sutures before the skin was closed using staples. Medially, the subcutaneous tissues were closed using 3-0 Vicryl interrupted sutures before the skin was closed using staples. A total of 20 cc of 0.5%  plain Sensorcaine was injected in and around the incision sites to help with postoperative analgesia. Sterile bulky dressings were applied to the wounds before the patient was placed into a posterior splint with a sugar tong supplement, maintaining the ankle in neutral dorsiflexion. The patient was then awakened, extubated and returned to the recovery room in satisfactory condition after tolerating the procedure well.

## 2020-11-07 NOTE — Anesthesia Preprocedure Evaluation (Signed)
Anesthesia Evaluation  Patient identified by MRN, date of birth, ID band Patient awake    Reviewed: Allergy & Precautions, NPO status , Patient's Chart, lab work & pertinent test results  History of Anesthesia Complications Negative for: history of anesthetic complications  Airway Mallampati: II  TM Distance: >3 FB Neck ROM: Full    Dental no notable dental hx.    Pulmonary neg pulmonary ROS, neg sleep apnea, neg COPD,    breath sounds clear to auscultation- rhonchi (-) wheezing      Cardiovascular Exercise Tolerance: Good (-) hypertension(-) CAD and (-) Past MI  Rhythm:Regular Rate:Normal - Systolic murmurs and - Diastolic murmurs    Neuro/Psych neg Seizures negative neurological ROS  negative psych ROS   GI/Hepatic negative GI ROS, Neg liver ROS,   Endo/Other  negative endocrine ROSneg diabetes  Renal/GU negative Renal ROS     Musculoskeletal R ankle fracture    Abdominal (+) - obese,   Peds  Hematology negative hematology ROS (+)   Anesthesia Other Findings    Reproductive/Obstetrics                             Anesthesia Physical Anesthesia Plan  ASA: 1  Anesthesia Plan: General   Post-op Pain Management:    Induction: Intravenous  PONV Risk Score and Plan: 2 and Ondansetron, Dexamethasone and Midazolam  Airway Management Planned: Oral ETT  Additional Equipment:   Intra-op Plan:   Post-operative Plan: Extubation in OR  Informed Consent: I have reviewed the patients History and Physical, chart, labs and discussed the procedure including the risks, benefits and alternatives for the proposed anesthesia with the patient or authorized representative who has indicated his/her understanding and acceptance.     Dental advisory given  Plan Discussed with: CRNA and Anesthesiologist  Anesthesia Plan Comments:         Anesthesia Quick Evaluation

## 2020-11-07 NOTE — ED Provider Notes (Signed)
[facility] Emergency Department Provider Note  ____________________________________________   Event Date/Time   First MD Initiated Contact with Patient 11/07/20 1147     (approximate)  I have reviewed the triage vital signs and the nursing notes.   HISTORY  Chief Complaint No chief complaint on file.    HPI Annette Chapman is a 50 y.o. female presents emergency department complaining of right ankle pain.  Patient stepped off a curb and twisted her ankle yesterday.  Unable to bear weight since.  No numbness or tingling.  No other injuries.  History reviewed. No pertinent past medical history.  There are no problems to display for this patient.     Prior to Admission medications   Not on File    Allergies Codeine  No family history on file.  Social History    Review of Systems  Constitutional: No fever/chills Eyes: No visual changes. ENT: No sore throat. Respiratory: Denies cough Cardiovascular: Denies chest pain Gastrointestinal: Denies abdominal pain Genitourinary: Negative for dysuria. Musculoskeletal: Negative for back pain.  Positive for right ankle pain Skin: Negative for rash. Psychiatric: no mood changes,     ____________________________________________   PHYSICAL EXAM:  VITAL SIGNS: ED Triage Vitals  Enc Vitals Group     BP 11/07/20 1100 102/71     Pulse Rate 11/07/20 1100 82     Resp 11/07/20 1100 18     Temp 11/07/20 1100 98.8 F (37.1 C)     Temp Source 11/07/20 1100 Oral     SpO2 11/07/20 1100 98 %     Weight 11/07/20 1101 162 lb (73.5 kg)     Height 11/07/20 1101 5' 8.5" (1.74 m)     Head Circumference --      Peak Flow --      Pain Score 11/07/20 1101 9     Pain Loc --      Pain Edu? --      Excl. in GC? --     Constitutional: Alert and oriented. Well appearing and in no acute distress. Eyes: Conjunctivae are normal.  Head: Atraumatic. Nose: No congestion/rhinnorhea. Mouth/Throat: Mucous  membranes are moist.   Neck:  supple no lymphadenopathy noted Cardiovascular: Normal rate, regular rhythm.  Respiratory: Normal respiratory effort.  No retractions,  GU: deferred Musculoskeletal: Right ankle is grossly swollen, tender along the lateral and medial aspect.  Neurovascular is intact.  Achilles is intact  neurologic:  Normal speech and language.  Skin:  Skin is warm, dry and intact. No rash noted. Psychiatric: Mood and affect are normal. Speech and behavior are normal.  ____________________________________________   LABS (all labs ordered are listed, but only abnormal results are displayed)  Labs Reviewed  COMPREHENSIVE METABOLIC PANEL - Abnormal; Notable for the following components:      Result Value   Glucose, Bld 109 (*)    All other components within normal limits  CBC WITH DIFFERENTIAL/PLATELET - Abnormal; Notable for the following components:   RBC 3.60 (*)    HCT 34.1 (*)    All other components within normal limits  RESP PANEL BY RT-PCR (FLU A&B, COVID) [id number]   ____________________________________________   ____________________________________________  RADIOLOGY  X-ray of the right ankle  ____________________________________________   PROCEDURES  Procedure(s) performed:   .Ortho Injury Treatment  Date/Time: 11/07/2020 12:38 PM Performed by: Faythe Ghee, PA-C Authorized by: Faythe Ghee, PA-C   Consent:    Consent obtained:  Verbal   Consent given by:  Patient  Risks discussed:  Nerve damage, restricted joint movement, vascular damage and stiffnessInjury location: ankle Location details: right ankle Injury type: fracture Fracture type: trimalleolar Pre-procedure neurovascular assessment: neurovascularly intact Pre-procedure distal perfusion: normal Pre-procedure neurological function: normal Pre-procedure range of motion: normal  Anesthesia: Local anesthesia used: no  Patient sedated: NoManipulation performed:  no Immobilization: splint Splint type: short leg Splint Applied by: ED Provider Supplies used: cotton padding, elastic bandage and Ortho-Glass Post-procedure neurovascular assessment: post-procedure neurovascularly intact Post-procedure distal perfusion: normal Post-procedure neurological function: normal Post-procedure range of motion: normal Comments: Performed by me and dr bradler      ____________________________________________   INITIAL IMPRESSION / ASSESSMENT AND PLAN / ED COURSE  Pertinent labs & imaging results that were available during my care of the patient were reviewed by me and considered in my medical decision making (see chart for details).   Is a 50 year old female presents emergency department with complaints of right ankle pain.  See HPI.  Physical exam shows patient appears stable  X-ray of the right ankle reviewed by me confirmed by radiology to have a fibular fracture, radiologist is reading it as trimalleolar  Consult to orthopedics. Dr Joice Lofts, will be taking to surgery today, asking for basic labs and ekg for preop  Labs are reassuring, CBC and metabolic panel are normal.  COVID test still pending.  Patient is in agreement with treatment plan for surgery today.  She was notified to remain NPO.   Annette Chapman was evaluated in Emergency Department on 11/07/2020 for the symptoms described in the history of present illness. She was evaluated in the context of the global COVID-19 pandemic, which necessitated consideration that the patient might be at risk for infection with the SARS-CoV-2 virus that causes COVID-19. Institutional protocols and algorithms that pertain to the evaluation of patients at risk for COVID-19 are in a state of rapid change based on information released by regulatory bodies including the CDC and federal and state organizations. These policies and algorithms were followed during the patient's care in the ED.    As part of my medical  decision making, I reviewed the following data within the electronic MEDICAL RECORD NUMBER History obtained from family, Nursing notes reviewed and incorporated, Labs reviewed , Old chart reviewed, Radiograph reviewed , A consult was requested and obtained from this/these consultant(s) Orthopedics, Notes from prior ED visits, and [place] Controlled Substance Database  ____________________________________________   FINAL CLINICAL IMPRESSION(S) / ED DIAGNOSES  Final diagnoses:  Closed trimalleolar fracture of right ankle, initial encounter      NEW MEDICATIONS STARTED DURING THIS VISIT:  New Prescriptions   No medications on file     Note:  This document was prepared using Dragon voice recognition software and may include unintentional dictation errors.    Faythe Ghee, PA-C 11/07/20 1344    Gilles Chiquito, MD 11/07/20 [phone]

## 2020-11-08 ENCOUNTER — Encounter: Payer: Self-pay | Admitting: Surgery

## 2020-11-08 MED ORDER — HYDROCODONE-ACETAMINOPHEN 5-325 MG PO TABS: 1.0000 | ORAL_TABLET | Freq: Four times a day (QID) | ORAL | 0 refills | Status: AC | PRN

## 2020-11-08 MED ORDER — ONDANSETRON HCL 4 MG PO TABS: 4.0000 mg | ORAL_TABLET | Freq: Four times a day (QID) | ORAL | 0 refills | Status: AC | PRN

## 2020-11-08 MED ORDER — ASPIRIN EC 325 MG PO TBEC: 325.0000 mg | DELAYED_RELEASE_TABLET | Freq: Every day | ORAL | 0 refills | Status: AC

## 2020-11-08 NOTE — Discharge Instructions (Signed)
Diet: As you were doing prior to hospitalization   Shower:  May shower but keep the wounds dry, use an occlusive plastic wrap, NO SOAKING IN TUB.    Dressing:  Leave splint in place until first post-op appointment.  Activity:  Increase activity slowly as tolerated, but follow the weight bearing instructions below.  No lifting or driving for 6 weeks.  Weight Bearing:   Non-weightbearing to the right leg until first follow-up.  Blood Clot Prevention: Take 1 325mg  aspirin daily for blood clot prevention.  To prevent constipation: you may use a stool softener such as -  Colace (over the counter) 100 mg by mouth twice a day  Drink plenty of fluids (prune juice may be helpful) and high fiber foods Miralax (over the counter) for constipation as needed.    Itching:  If you experience itching with your medications, try taking only a single pain pill, or even half a pain pill at a time.  You may take up to 10 pain pills per day, and you can also use benadryl over the counter for itching or also to help with sleep.   Precautions:  If you experience chest pain or shortness of breath - call 911 immediately for transfer to the hospital emergency department!!  If you develop a fever greater that 101 F, purulent drainage from wound, increased redness or drainage from wound, or calf pain-Call [facility]                                              Follow- Up Appointment:  Please call for an appointment to be seen in 2 weeks at [facility]

## 2020-11-08 NOTE — Progress Notes (Signed)
DISCHARGE NOTE:  Pt given discharge and scripts. Pt verbalized understanding. Walker sent with pt. Pt wheeled to car by staff. Sister providing transportation.

## 2020-11-08 NOTE — Anesthesia Postprocedure Evaluation (Signed)
Anesthesia Post Note  Patient: MERNA BALDI  Procedure(s) Performed: OPEN REDUCTION INTERNAL FIXATION (ORIF) ANKLE FRACTURE (Right: Ankle)  Patient location during evaluation: PACU Anesthesia Type: General Level of consciousness: awake and alert Pain management: pain level controlled Vital Signs Assessment: post-procedure vital signs reviewed and stable Respiratory status: spontaneous breathing, nonlabored ventilation, respiratory function stable and patient connected to nasal cannula oxygen Cardiovascular status: blood pressure returned to baseline and stable Postop Assessment: no apparent nausea or vomiting Anesthetic complications: no   No notable events documented.   Last Vitals:  Vitals:   11/07/20 1800 11/07/20 1823  BP: (!) 145/74 124/79  Pulse: 65 67  Resp: 16 18  Temp: 36.5 C 36.6 C  SpO2: 99% 100%    Last Pain:  Vitals:   11/07/20 1823  TempSrc: Oral  PainSc:                  Cleda Mccreedy Bernedette Auston

## 2020-11-08 NOTE — Progress Notes (Addendum)
Met with the patient to discuss DC plan and needs She will be staying with her niece that has a single level home, she needs a RW She has no insurance I notified [employer] of the need and to run thru charity She will not be doing PT until after her office follow up, [facility] was provided and instructed to call to make an application appointment as she has no PCP, she stated understanding She stated that she has no additional needs

## 2020-11-08 NOTE — Evaluation (Signed)
Physical Therapy Evaluation Patient Details Name: Annette Chapman MRN: [id number] DOB: 07-27-1970 Today's Date: 11/08/2020  History of Present Illness  50 y.o. female who sustained an injury to the right ankle as she stepped off a curb and rolled her ankle. X-rays reveal a minimally displaced trimalleolar fracture of her right ankle. Pt is now s/p open reduction and internal fixation of medial and lateral malleoli, right ankle  Clinical Impression  Pt received supine in bed, daughter in room and agreeable to therapy. Pt lives alone in a single level home with 3 STS, no railing. Pt plans to d/c to niece's home where family will be available as needed and there is a ramp to enter home.   Pt performed bed mobility Mod I. STS with RW with SUP due to SLS to perform. Pt ambulated with "hop-to" pattern using BUE support on RW. Good foot clearance with hop the first 48ft. Hop length became shorter with decreased clearance as pt fatigued. Once in the therapy gym, pt reported significant fatigue. W/c utilized to return to room. Steps were attempted as pt expressed wanting to return home. Pt bumped up on bottom with safe technique; pt was unable to stand using LLE only to maintain NWB on RLE once at the top of the stairs. PT and pt agree steps are not safe at this time. Would benefit from skilled PT to address above deficits and promote optimal return to PLOF.]     Recommendations for follow up therapy are one component of a multi-disciplinary discharge planning process, led by the attending physician.  Recommendations may be updated based on patient status, additional functional criteria and insurance authorization.  Follow Up Recommendations No PT follow up;Supervision - Intermittent (Follow surgeons rec: pt will need OP PT in the future)    Equipment Recommendations  Rolling walker with 5" wheels    Recommendations for Other Services       Precautions / Restrictions Precautions Precautions:  None Restrictions Weight Bearing Restrictions: Yes RLE Weight Bearing: Non weight bearing      Mobility  Bed Mobility Overal bed mobility: Modified Independent                  Transfers Overall transfer level: Needs assistance Equipment used: Rolling walker (2 wheeled) Transfers: Sit to/from Stand Sit to Stand: Supervision         General transfer comment: Close SUP for safety; pt maintain RLE NWB well  Ambulation/Gait Ambulation/Gait assistance: Min guard Gait Distance (Feet): 80 Feet Assistive device: Rolling walker (2 wheeled)   Gait velocity: decreased   General Gait Details: "hop-to" pattern to maintain NWB on RLE using BUE on RW. CGA for safety as foot clearance decreased with fatigue (final 55ft)  Stairs Stairs: Yes   Stair Management: No rails;Seated/boosting Number of Stairs: 4 General stair comments: Attempted to navigate steps via bumping up on pt bottom - pt was safe until standing at top of the steps. Pt is unable to stand using LLE only. PT educated pt is not safe to navigate stairs without railings while NWB. After attempting, pt agrees.  Wheelchair Mobility    Modified Rankin (Stroke Patients Only)       Balance Overall balance assessment: Needs assistance Sitting-balance support: No upper extremity supported;Feet supported Sitting balance-Leahy Scale: Normal     Standing balance support: During functional activity;Bilateral upper extremity supported Standing balance-Leahy Scale: Poor Standing balance comment: SLS on LLE - requires use of RW to balance due to NWB status  Pertinent Vitals/Pain Pain Assessment: 0-10 Pain Score: 3  Pain Location: R ankle    Home Living Family/patient expects to be discharged to:: Private residence Living Arrangements: Alone Available Help at Discharge: Family;Available PRN/intermittently Type of Home: House Home Access: Stairs to enter Entrance  Stairs-Rails: None Entrance Stairs-Number of Steps: 3 Home Layout: One level Home Equipment: None      Prior Function Level of Independence: Independent         Comments: Plans to d/c to her niece's home who has a ramp to enter home and family will be present as needed. 1 fall in the past 6 months resulting in this ankle fx. 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     Hand Dominance        Extremity/Trunk Assessment   Upper Extremity Assessment Upper Extremity Assessment: Overall WFL for tasks assessed    Lower Extremity Assessment Lower Extremity Assessment: Overall WFL for tasks assessed;RLE deficits/detail RLE Deficits / Details: unable to test due to ankle fx and NWB status - hip flexion and knee extension at least 3/5. RLE: Unable to fully assess due to immobilization;Unable to fully assess due to pain RLE Sensation: WNL       Communication   Communication: No difficulties  Cognition Arousal/Alertness: Awake/alert Behavior During Therapy: WFL for tasks assessed/performed Overall Cognitive Status: Within Functional Limits for tasks assessed                                 General Comments: A&Ox4      General Comments      Exercises Other Exercises Other Exercises: Educated on LE therex for hip, quad, HS strength including SLR, hip abduction, LAQ, hip extenstion (with feet hanging off foot of bed) in order to maintain strength while NWB. Pt did not perform however verbalized understanding. Pt and daughter also educated on NWB status, AD options (RW vs crutches - pt picked RW), stair navigation and rec to use a ramp.   Assessment/Plan    PT Assessment Patient needs continued PT services  PT Problem List Decreased strength;Decreased range of motion;Decreased activity tolerance;Decreased safety awareness;Decreased skin integrity;Decreased balance;Decreased mobility       PT Treatment Interventions DME instruction;Balance  training;Gait training;Neuromuscular re-education;Functional mobility training;Therapeutic activities;Therapeutic exercise;Patient/family education    PT Goals (Current goals can be found in the Care Plan section)  Acute Rehab PT Goals Patient Stated Goal: to go home PT Goal Formulation: With patient Time For Goal Achievement: 11/22/20 Potential to Achieve Goals: Good    Frequency 7X/week   Barriers to discharge Inaccessible home environment Pt unable to access her home due to steps to enter with no railing - pt will be staying with her neice who has a ramp.    Co-evaluation               AM-PAC PT "6 Clicks" Mobility  Outcome Measure Help needed turning from your back to your side while in a flat bed without using bedrails?: None Help needed moving from lying on your back to sitting on the side of a flat bed without using bedrails?: None Help needed moving to and from a bed to a chair (including a wheelchair)?: A Little Help needed standing up from a chair using your arms (e.g., wheelchair or bedside chair)?: A Little Help needed to walk in hospital room?: A Little Help needed climbing 3-5 steps with a railing? :  Total 6 Click Score: 18    End of Session Equipment Utilized During Treatment: Gait belt Activity Tolerance: Patient tolerated treatment well;Patient limited by fatigue Patient left: in bed;with call bell/phone within reach;with family/visitor present;with SCD's reapplied Nurse Communication: Mobility status;Weight bearing status;Precautions PT Visit Diagnosis: Unsteadiness on feet (R26.81);Other abnormalities of gait and mobility (R26.89);Muscle weakness (generalized) (M62.81)    Time: 3846-6599 PT Time Calculation (min) (ACUTE ONLY): 33 min   Charges:   PT Evaluation $PT Eval Low Complexity: 1 Low PT Treatments $Therapeutic Activity: 23-37 mins       Basilia Jumbo PT, DPT 11/08/20 1:11 PM [phone]   Lavenia Atlas 11/08/2020, 1:03 PM

## 2020-11-08 NOTE — Discharge Summary (Signed)
Physician Discharge Summary  Patient ID: Annette Chapman MRN: [id number] DOB/AGE: [birth year] 50 y.o.  Admit date: 11/07/2020 Discharge date: 11/08/2020  Admission Diagnoses:  Surgery, elective [Z41.9] Closed trimalleolar fracture of right ankle, initial encounter [S82.851A] Ankle fracture, right [S82.891A]  Discharge Diagnoses: Patient Active Problem List   Diagnosis Date Noted   Ankle fracture, right 11/07/2020    History reviewed. No pertinent past medical history.   Transfusion: None.   Consultants (if any):   Discharged Condition: Improved  Hospital Course: Annette Chapman is an 50 y.o. female who was admitted 11/07/2020 with a diagnosis of a minimally displaced trimalleolar fracture of the right ankle and went to the operating room on 11/07/2020 and underwent the above named procedures.    Surgeries: Procedure(s): OPEN REDUCTION INTERNAL FIXATION (ORIF) ANKLE FRACTURE on 11/07/2020 Patient tolerated the surgery well. Taken to PACU where she was stabilized and then transferred to the orthopedic floor.  Started on Lovenox 40mg  q 24 hrs. Heels elevated on bed with rolled towels. No evidence of DVT. Negative Homan to available leg. Physical therapy started on day #1 for gait training and transfer. OT started day #1 for ADL and assisted devices.  Patient's IV was removed on POD1.  Implants:  [employer] ALPS 7-hole composite locking plate and screws  She was given perioperative antibiotics:  Anti-infectives (From admission, onward)    Start     Dose/Rate Route Frequency Ordered Stop   11/07/20 2200  ceFAZolin (ANCEF) IVPB 2g/100 mL premix        2 g 200 mL/hr over 30 Minutes Intravenous Every 6 hours 11/07/20 1808 11/08/20 1559   11/07/20 1527  ceFAZolin (ANCEF) 2-4 GM/100ML-% IVPB       Note to Pharmacy: 11/09/20, Cryst: cabinet override      11/07/20 1527 11/07/20 1544   11/07/20 1447  ceFAZolin (ANCEF) IVPB 2g/100 mL premix        2 g 200 mL/hr over 30 Minutes  Intravenous 30 min pre-op 11/07/20 1447 11/07/20 1553     .  She was given sequential compression devices, early ambulation, and Lovenox for DVT prophylaxis.  She benefited maximally from the hospital stay and there were no complications.    Recent vital signs:  Vitals:   11/07/20 1823 11/08/20 0544  BP: 124/79 (!) 95/53  Pulse: 67 (!) 58  Resp: 18 20  Temp: 97.9 F (36.6 C) 97.6 F (36.4 C)  SpO2: 100% 98%    Recent laboratory studies:  Lab Results  Component Value Date   HGB 12.1 11/07/2020   HGB 12.5 09/12/2011   Lab Results  Component Value Date   WBC 6.7 11/07/2020   PLT 349 11/07/2020   No results found for: INR Lab Results  Component Value Date   NA 135 11/07/2020   K 3.8 11/07/2020   CL 99 11/07/2020   CO2 28 11/07/2020   BUN 7 11/07/2020   CREATININE 0.70 11/07/2020   GLUCOSE 109 (H) 11/07/2020    Discharge Medications:   Allergies as of 11/08/2020       Reactions   Latex Rash   Strawberry (diagnostic) Hives   Codeine         Medication List     TAKE these medications    aspirin EC 325 MG tablet Take 1 tablet (325 mg total) by mouth daily.   HYDROcodone-acetaminophen 5-325 MG tablet Commonly known as: NORCO/VICODIN Take 1-2 tablets by mouth every 6 (six) hours as needed for moderate pain (pain score 4-6).  loratadine 10 MG tablet Commonly known as: CLARITIN Take 10 mg by mouth daily.   ondansetron 4 MG tablet Commonly known as: ZOFRAN Take 1 tablet (4 mg total) by mouth every 6 (six) hours as needed for nausea.        Diagnostic Studies: DG Ankle 2 Views Right  Result Date: 11/07/2020 CLINICAL DATA:  ORIF right ankle EXAM: RIGHT ANKLE - 2 VIEW; DG C-ARM 1-60 MIN COMPARISON:  11/07/2020 FINDINGS: Six fluoroscopic images are obtained during the performance of the procedure and are provided for interpretation only. Plate and screw fixation is seen across the distal fibular fracture, with anatomic alignment. A solitary cannulated  screw traverses the prior medial malleolar fracture, with anatomic alignment. Diffuse soft tissue swelling. Please refer to operative report. FLUOROSCOPY TIME:  12 seconds IMPRESSION: 1. ORIF right ankle as above. Electronically Signed   By: Sharlet Salina M.D.   On: 11/07/2020 17:25   DG Ankle Complete Right  Result Date: 11/07/2020 CLINICAL DATA:  Fall from curved, pain and swelling EXAM: RIGHT ANKLE - COMPLETE 3+ VIEW COMPARISON:  None. FINDINGS: Osteopenia. There are minimally displaced trimalleolar fractures of the right ankle. Minimal ankle mortise widening. Extensive soft tissue edema about the foot and ankle. IMPRESSION: 1. Minimally displaced trimalleolar fractures of the right ankle with minimal ankle mortise widening. 2.  Extensive soft tissue edema. Electronically Signed   By: Lauralyn Primes M.D.   On: 11/07/2020 11:55   DG C-Arm 1-60 Min  Result Date: 11/07/2020 CLINICAL DATA:  ORIF right ankle EXAM: RIGHT ANKLE - 2 VIEW; DG C-ARM 1-60 MIN COMPARISON:  11/07/2020 FINDINGS: Six fluoroscopic images are obtained during the performance of the procedure and are provided for interpretation only. Plate and screw fixation is seen across the distal fibular fracture, with anatomic alignment. A solitary cannulated screw traverses the prior medial malleolar fracture, with anatomic alignment. Diffuse soft tissue swelling. Please refer to operative report. FLUOROSCOPY TIME:  12 seconds IMPRESSION: 1. ORIF right ankle as above. Electronically Signed   By: Sharlet Salina M.D.   On: 11/07/2020 17:25    Disposition: Plan for discharge home today pending progress with therapy.   Follow-up Information     Anson [place], PA-C Follow up in 14 day(s).   Specialty: Physician Assistant Why: Follow-up in 10-14 days for splint removal. Contact information: [address] [place] [address] [phone]                Signed: Meriel Pica PA-C 11/08/2020, 7:35 AM

## 2020-11-08 NOTE — Progress Notes (Signed)
  Subjective: 1 Day Post-Op Procedure(s) (LRB): OPEN REDUCTION INTERNAL FIXATION (ORIF) ANKLE FRACTURE (Right) Patient reports pain as mild.   Patient is well, and has had no acute complaints or problems Plan is to go Home after hospital stay. Negative for chest pain and shortness of breath Fever: no Gastrointestinal:Negative for nausea and vomiting  Objective: Vital signs in last 24 hours: Temp:  [96.8 F (36 C)-98.8 F (37.1 C)] 97.6 F (36.4 C) (09/27 0544) Pulse Rate:  [58-82] 58 (09/27 0544) Resp:  [16-20] 20 (09/27 0544) BP: (95-153)/(53-85) 95/53 (09/27 0544) SpO2:  [98 %-100 %] 98 % (09/27 0544) Weight:  [73.5 kg] 73.5 kg (09/26 1459)  Intake/Output from previous day:  Intake/Output Summary (Last 24 hours) at 11/08/2020 0729 Last data filed at 11/08/2020 0000 Gross per 24 hour  Intake 1200 ml  Output 420 ml  Net 780 ml    Intake/Output this shift: No intake/output data recorded.  Labs: Recent Labs    11/07/20 1314  HGB 12.1   Recent Labs    11/07/20 1314  WBC 6.7  RBC 3.60*  HCT 34.1*  PLT 349   Recent Labs    11/07/20 1314  NA 135  K 3.8  CL 99  CO2 28  BUN 7  CREATININE 0.70  GLUCOSE 109*  CALCIUM 9.3   No results for input(s): LABPT, INR in the last 72 hours.   EXAM General - Patient is Alert, Appropriate, and Oriented Extremity - ABD soft Sensation intact distally Dressing/Incision - Short leg splint intact without drainage. Patient is able to flex and extend toes.  Cap refill intact. Intact to light touch to the dorsal and plantar aspect of her foot. Intact to light touch to the proximal aspect of the short leg splint. Motor Function - intact, moving toes well on exam.   History reviewed. No pertinent past medical history.  Assessment/Plan: 1 Day Post-Op Procedure(s) (LRB): OPEN REDUCTION INTERNAL FIXATION (ORIF) ANKLE FRACTURE (Right) Active Problems:   Ankle fracture, right  Estimated body mass index is 24.64 kg/m as  calculated from the following:   Height as of this encounter: 5\' 8"  (1.727 m).   Weight as of this encounter: 73.5 kg. Advance diet Up with therapy D/C IV fluids when tolerating po intake.  Labs reviewed from yesterday, BP 95/53.  No dizziness reported this AM Patient reports pain as mild.  Live at home alone. Up with therapy today.  Will likely need some DME before discharge, will wait on specifics from PT. Plan for discharge home this afternoon. No history of DVT, will discharge on aspirin for DVT prophylaxis.  DVT Prophylaxis - Lovenox and TED hose Non-weightbearing to the right leg.  , PA-C [facility] 11/08/2020, 7:29 AM

## 2020-11-08 NOTE — Progress Notes (Signed)
Pts BP 101/57, Horris Latino notified. Per Micah Noel pt is ok to discharge.

## 2020-11-08 NOTE — Progress Notes (Signed)
Pts BP 88/54, recheck 91/55. Pt asymptomatic. Horris Latino notified.

## 2023-08-25 IMAGING — XA DG ANKLE 2V *R*
1 series · 6 of 6 positions shown · non-contrast
Comparison: 11/07/2020

CLINICAL DATA: ORIF right ankle

EXAM:
RIGHT ANKLE - 2 VIEW; DG C-ARM 1-60 MIN

[Series 1: dg x-ray · 0.20mm/px · 6 of 6 slices shown]
[im 1/6]
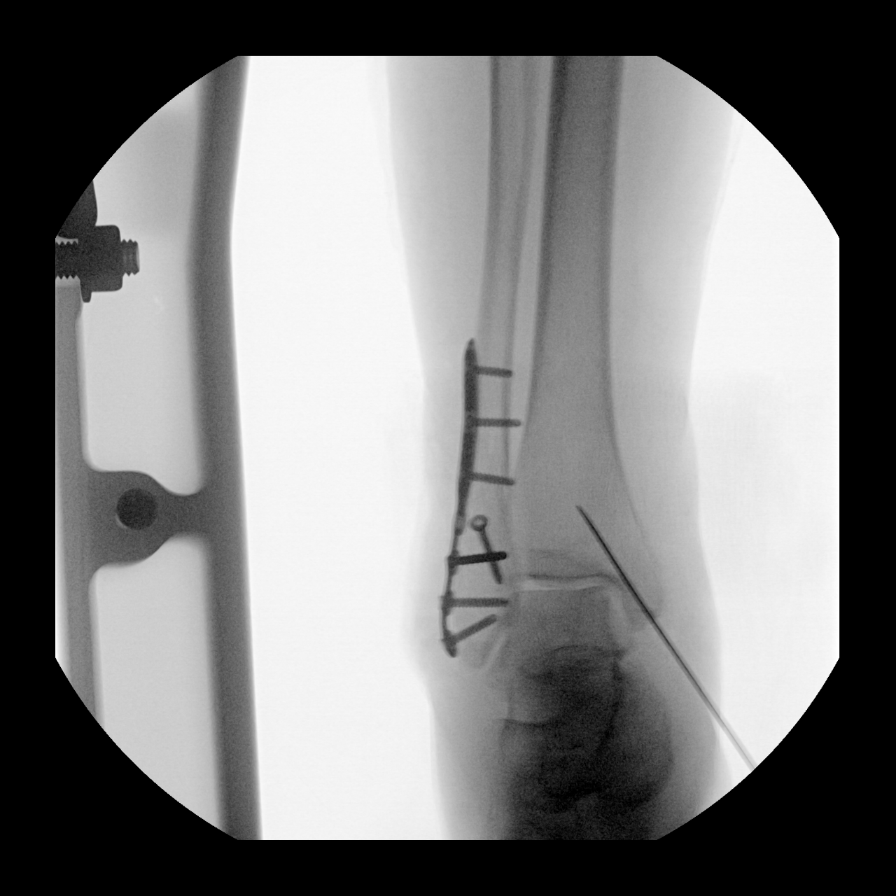
[im 2/6]
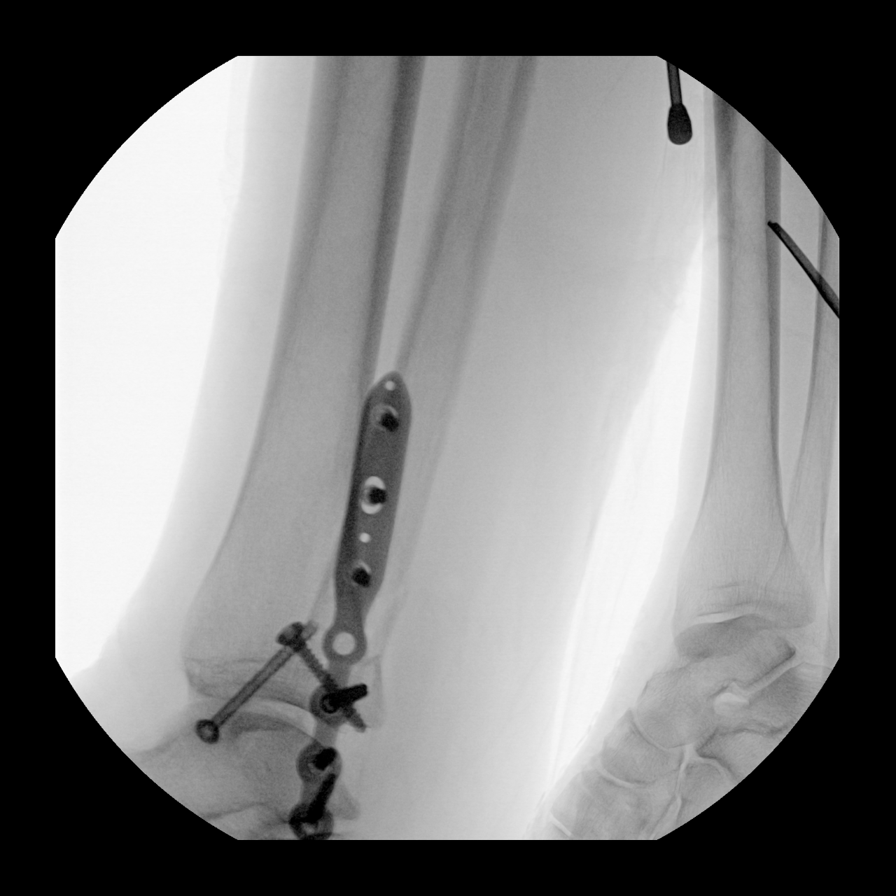
[im 3/6]
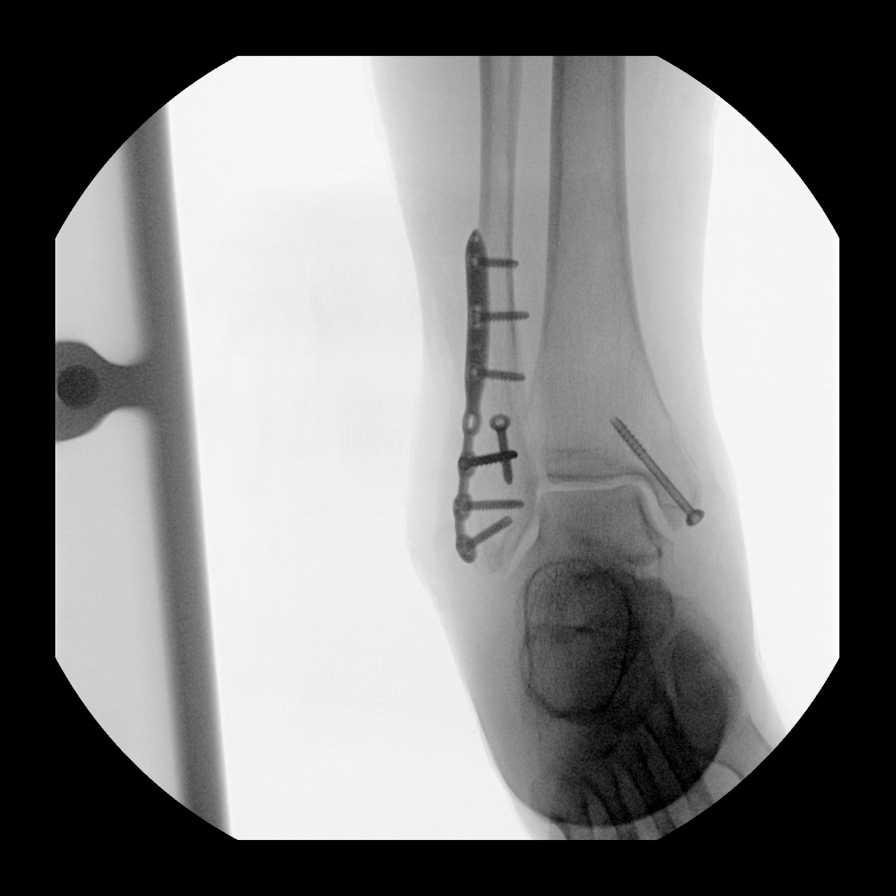
[im 4/6]
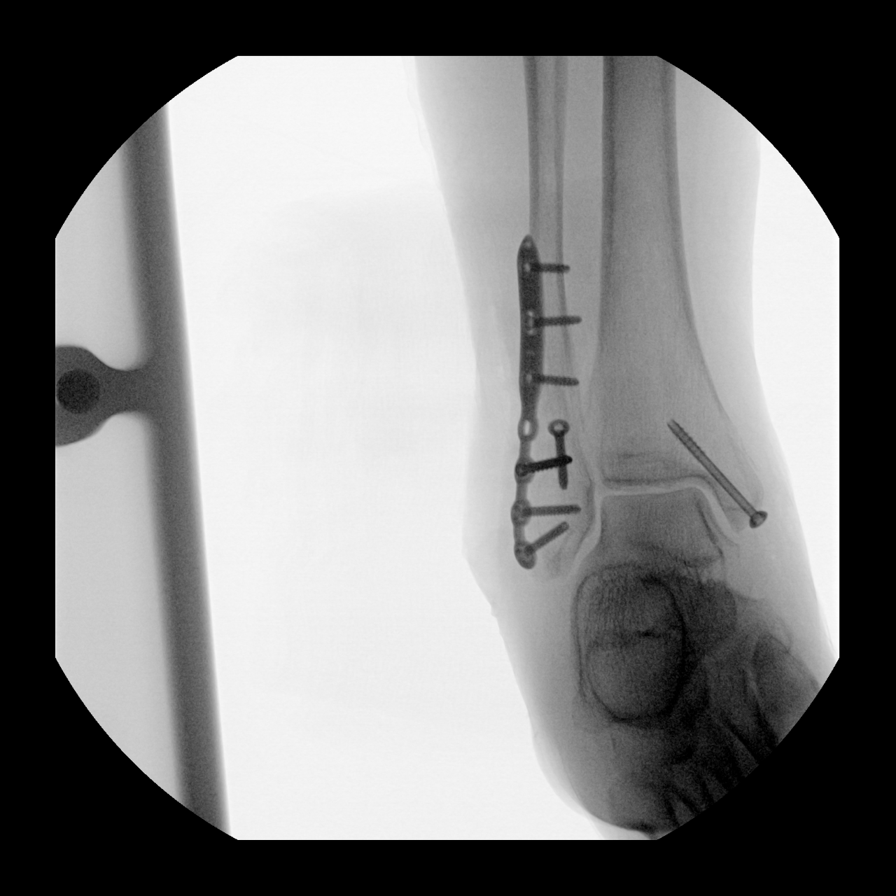
[im 5/6]
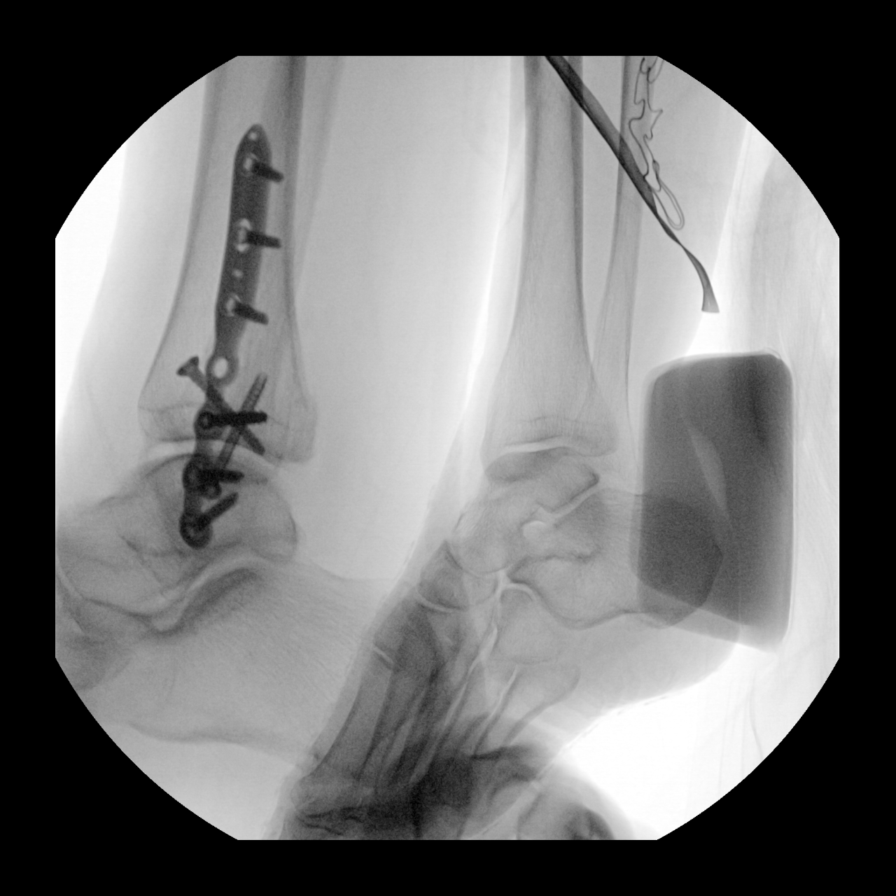
[im 6/6]
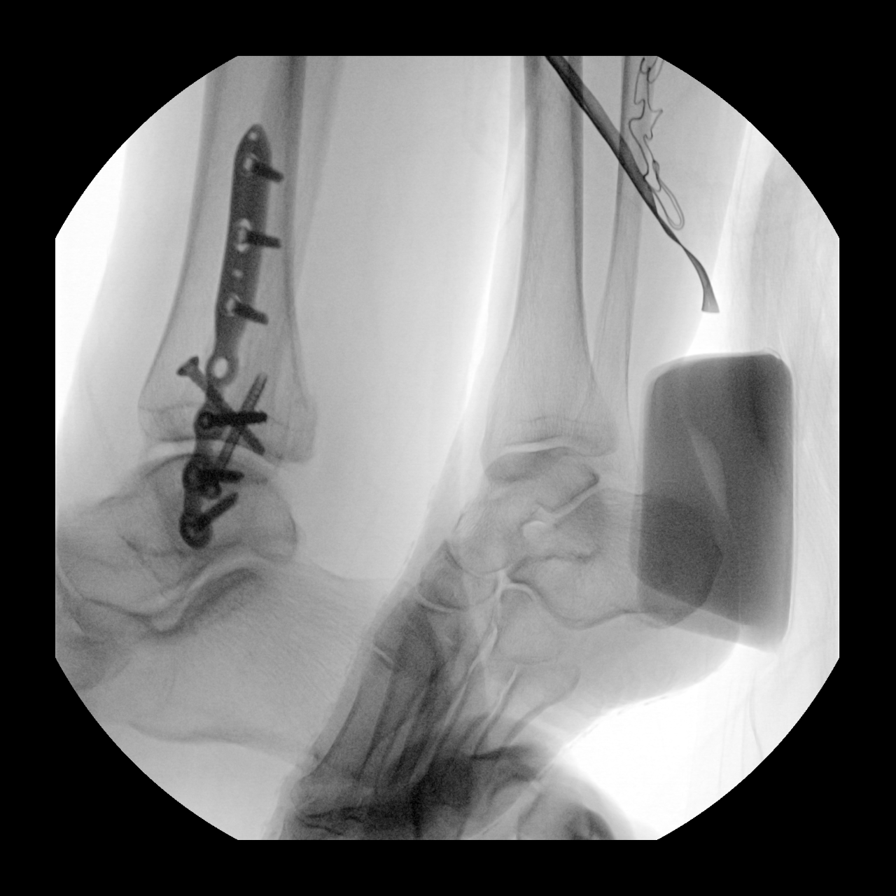

[6 of 6 positions shown; findings below may reference images not displayed]

FINDINGS: Six fluoroscopic images are obtained during the performance of the
procedure and are provided for interpretation only. Plate and screw
fixation is seen across the distal fibular fracture, with anatomic
alignment. A solitary cannulated screw traverses the prior medial
malleolar fracture, with anatomic alignment. Diffuse soft tissue
swelling. Please refer to operative report.

FLUOROSCOPY TIME:  12 seconds
IMPRESSION: 1. ORIF right ankle as above.
